# Patient Record
Sex: Female | Born: 1970 | Race: White | Hispanic: No | Marital: Married | Smoking: Former smoker
Health system: Southern US, Community
[De-identification: ages and names within clinical notes are randomized; demographics above are authoritative.]

## PROBLEM LIST (undated history)

## (undated) DIAGNOSIS — K219 Gastro-esophageal reflux disease without esophagitis: Secondary | ICD-10-CM

## (undated) DIAGNOSIS — L709 Acne, unspecified: Secondary | ICD-10-CM

## (undated) DIAGNOSIS — Z87448 Personal history of other diseases of urinary system: Secondary | ICD-10-CM

## (undated) DIAGNOSIS — R011 Cardiac murmur, unspecified: Secondary | ICD-10-CM

## (undated) DIAGNOSIS — I341 Nonrheumatic mitral (valve) prolapse: Secondary | ICD-10-CM

## (undated) DIAGNOSIS — J302 Other seasonal allergic rhinitis: Secondary | ICD-10-CM

## (undated) HISTORY — DX: Other seasonal allergic rhinitis: J30.2

## (undated) HISTORY — DX: Nonrheumatic mitral (valve) prolapse: I34.1

## (undated) HISTORY — DX: Gastro-esophageal reflux disease without esophagitis: K21.9

## (undated) HISTORY — DX: Cardiac murmur, unspecified: R01.1

## (undated) HISTORY — DX: Acne, unspecified: L70.9

## (undated) HISTORY — DX: Personal history of other diseases of urinary system: Z87.448

---
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
## 2003-07-05 ENCOUNTER — Other Ambulatory Visit: Admission: RE | Admit: 2003-07-05 | Discharge: 2003-07-05 | Payer: Self-pay | Admitting: Gynecology

## 2005-01-04 ENCOUNTER — Other Ambulatory Visit: Admission: RE | Admit: 2005-01-04 | Discharge: 2005-01-04 | Payer: Self-pay | Admitting: Gynecology

## 2005-03-01 ENCOUNTER — Inpatient Hospital Stay (HOSPITAL_COMMUNITY): Admission: AD | Admit: 2005-03-01 | Discharge: 2005-03-01 | Payer: Self-pay | Admitting: Gynecology

## 2005-03-27 ENCOUNTER — Inpatient Hospital Stay (HOSPITAL_COMMUNITY): Admission: AD | Admit: 2005-03-27 | Discharge: 2005-05-09 | Payer: Self-pay | Admitting: Gynecology

## 2005-03-28 ENCOUNTER — Ambulatory Visit: Payer: Self-pay | Admitting: Neonatology

## 2005-06-18 ENCOUNTER — Other Ambulatory Visit: Admission: RE | Admit: 2005-06-18 | Discharge: 2005-06-18 | Payer: Self-pay | Admitting: Gynecology

## 2006-06-22 ENCOUNTER — Other Ambulatory Visit: Admission: RE | Admit: 2006-06-22 | Discharge: 2006-06-22 | Payer: Self-pay | Admitting: Gynecology

## 2006-12-07 IMAGING — US US OB COMP +14 WK
1 series · 13 of 28 positions shown · non-contrast
Comparison: none

CLINICAL DATA: Assess growth and cervical length.

[Series 1: us ob comp +14 wk · 0.33mm/px · 13 of 52 slices shown]
[im 2/52]
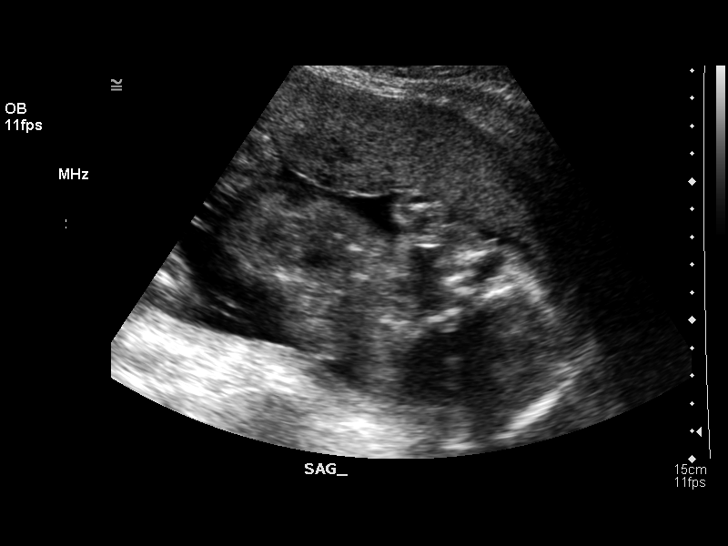
[im 6/52]
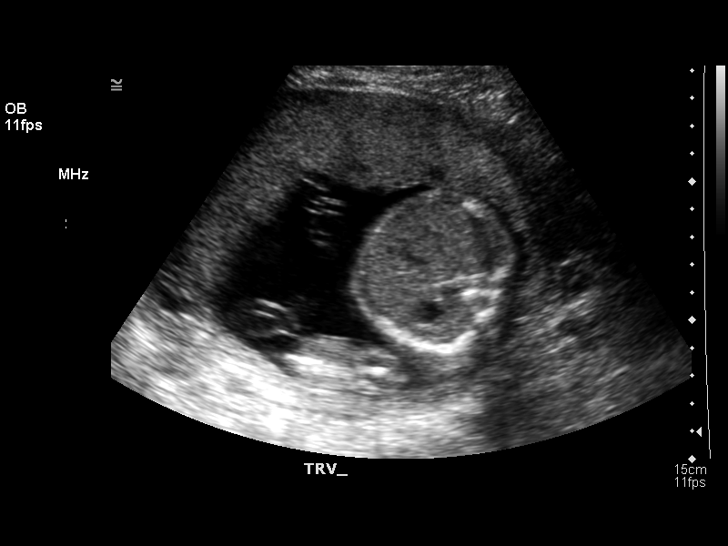
[im 10/52]
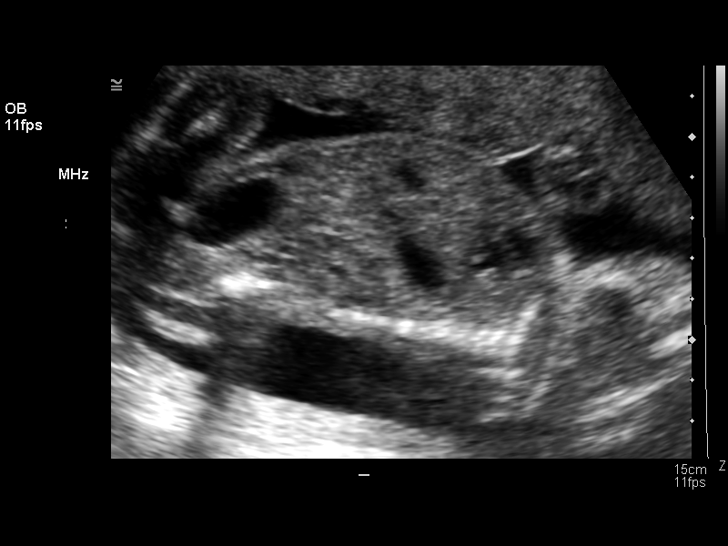
[im 14/52]
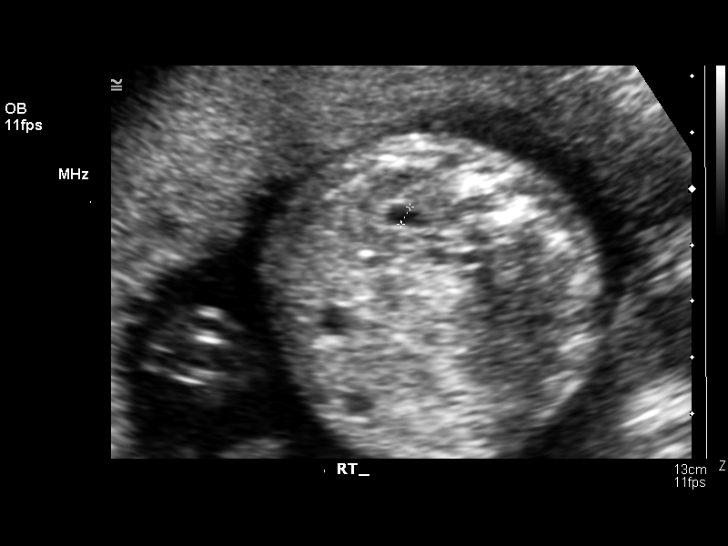
[im 18/52]
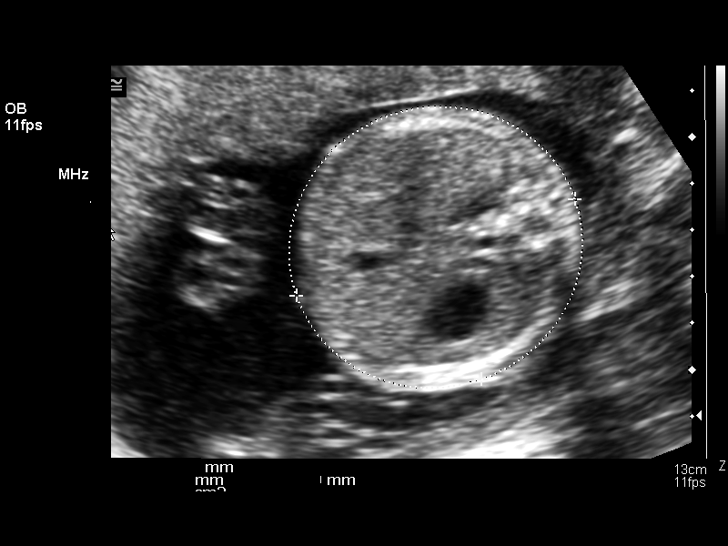
[im 21/52]
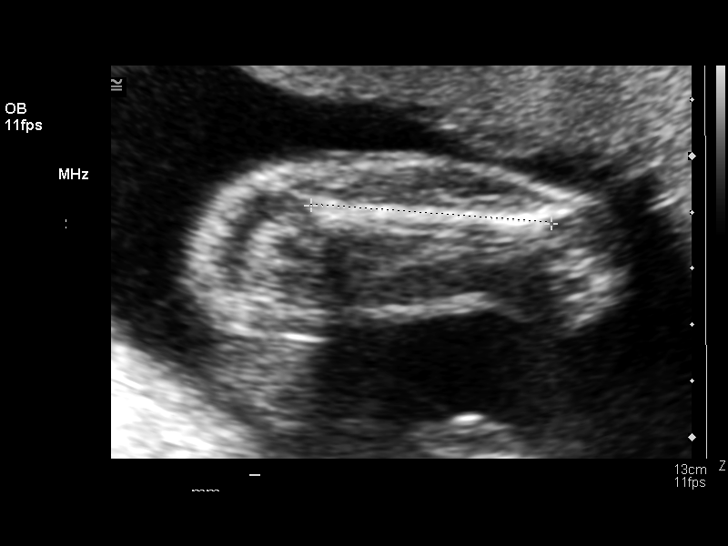
[im 27/52]
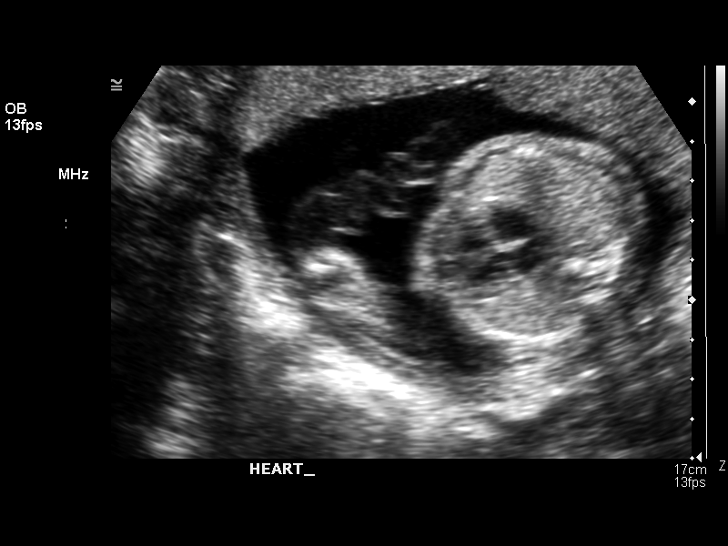
[im 31/52]
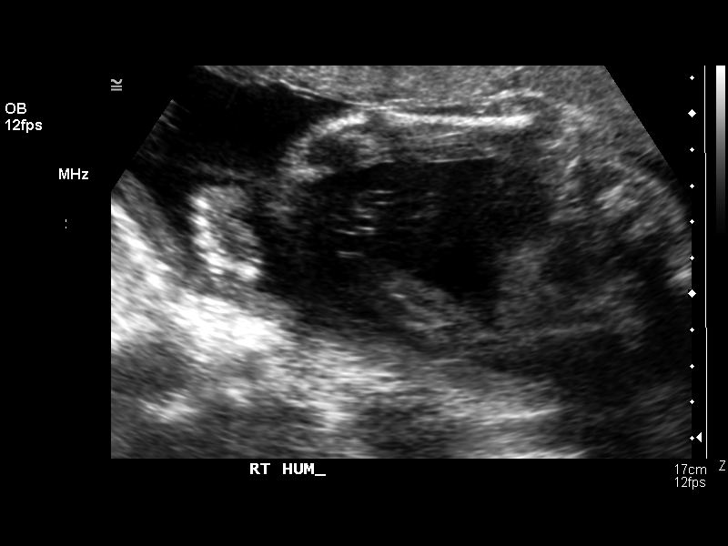
[im 35/52]
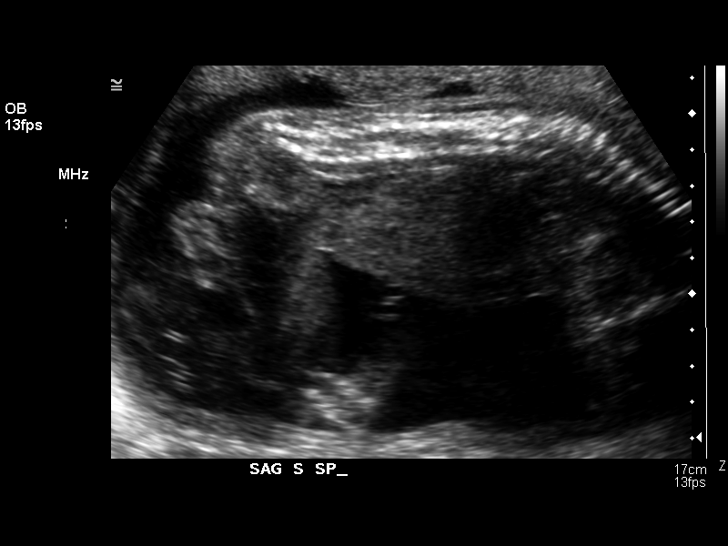
[im 38/52]
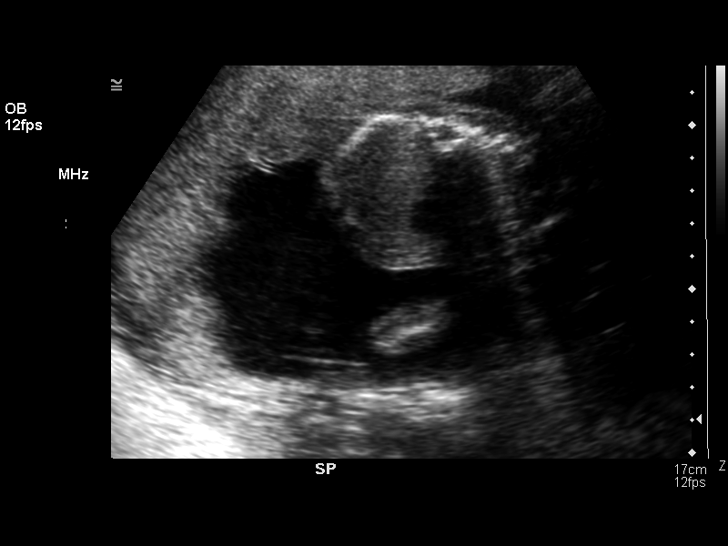
[im 42/52]
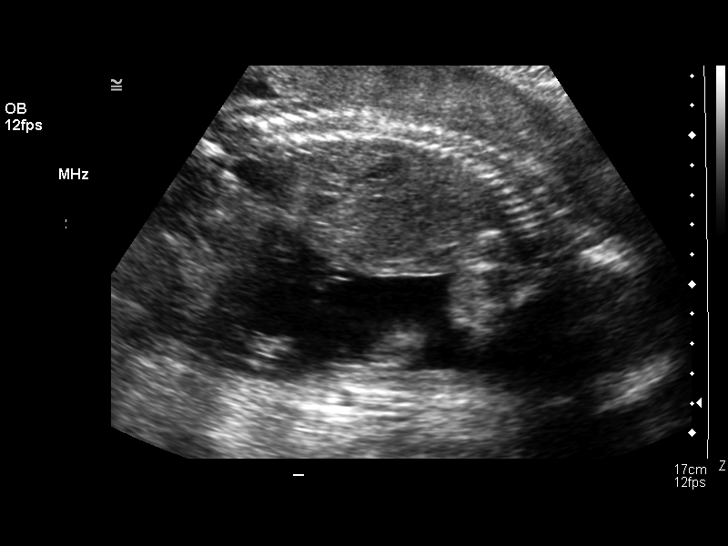
[im 46/52]
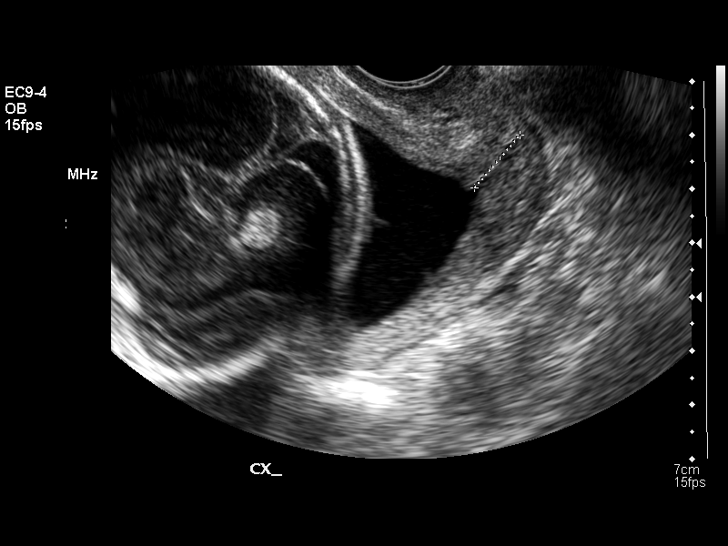
[im 50/52]
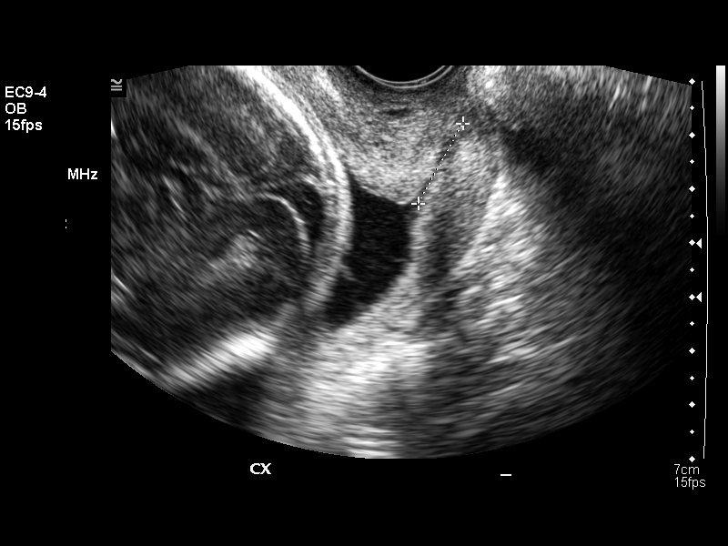

[13 of 28 positions shown; findings below may reference images not displayed]

OBSTETRICAL ULTRASOUND WITH TRANSVAGINAL:
Number of Fetuses:  1
Heart Rate:  139
Movement:  Yes
Breathing:    No  
Presentation:  Cephalic
Placental Location:  Anterior
Grade:  I
Previa:  No
Amniotic Fluid (Subjective):  Normal
Amniotic Fluid (Objective):   7.1 cm Vertical pocket 

FETAL BIOMETRY
BPD:  5.9 cm  24 w 1 d
HC:  22.0 cm   24 w 0 d
AC:  19.4 cm   24 w 1 d
FL:    4.3 cm   24 w 1 d

MEAN GA:  24 w 1 d

FETAL ANATOMY
Lateral Ventricles:    Previously seen  
Thalami/CSP:      Not visualized 
Posterior Fossa:  Not visualized 
Nuchal Region:    N/A
Spine:      Limited
4 Chamber Heart on Left:      Previously seen 
Stomach on Left:      Visualized 
3 Vessel Cord:    Visualized 
Cord Insertion site:    Visualized 
Kidneys:  Visualized 
Bladder:  Visualized 
Extremities:      Visualized 

MATERNAL UTERINE AND ADNEXAL FINDINGS
Cervix:   1.3 cm Transvaginally
Comments:  Dilatation at the level of the internal cervical os is noted measuring 19 cm.
IMPRESSION: 1.  Single intrauterine pregnancy demonstrating an estimated gestational age by ultrasound of 24 weeks and 1 day.  Correlation with assigned gestational age by LMP of 23 weeks and 3 days suggests appropriate growth.
2.  No late developing fetal anatomic abnormalities are identified associated with the stomach, kidneys or bladder.  Today the 3-vessel cord and extremities were visualized.  .
3.  Subjectively and quantitatively normal amniotic fluid volume.
4.  Shortened cervical length with dilatation at the level of the internal cervical os measuring 1.9 cm.  Today the patient?s cervix measures 1.3 cm endovaginally.

## 2006-12-21 IMAGING — US US OB LIMITED
1 series · 13 of 28 positions shown · non-contrast
Comparison: none

CLINICAL DATA: 23 weeks estimated gestational age with short cervix, on bed-rest for cramping.  Assess cervical length.

[Series 1: us ob limited · 0.35mm/px · 13 of 28 slices shown]
[im 2/28]
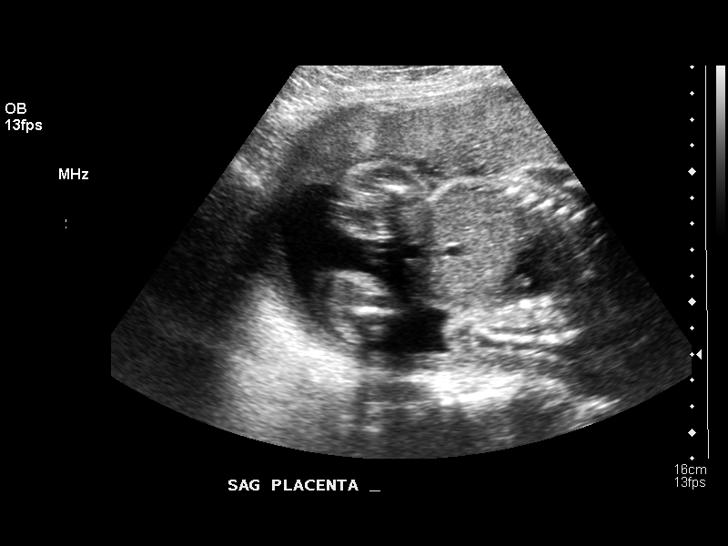
[im 4/28]
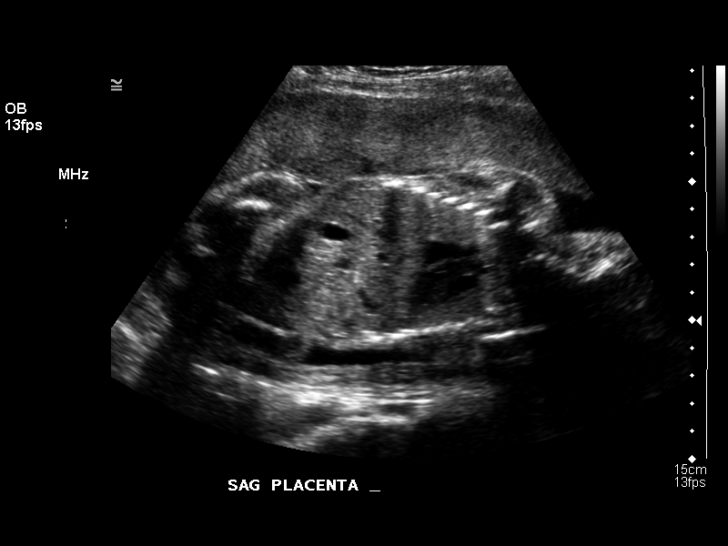
[im 6/28]
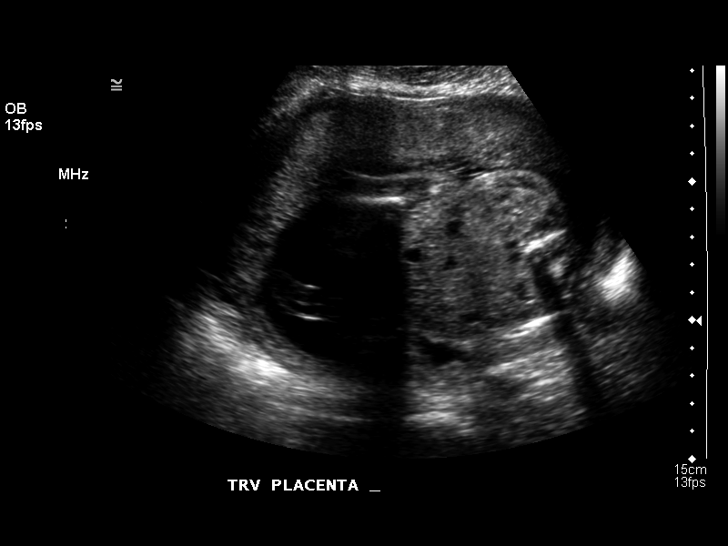
[im 8/28]
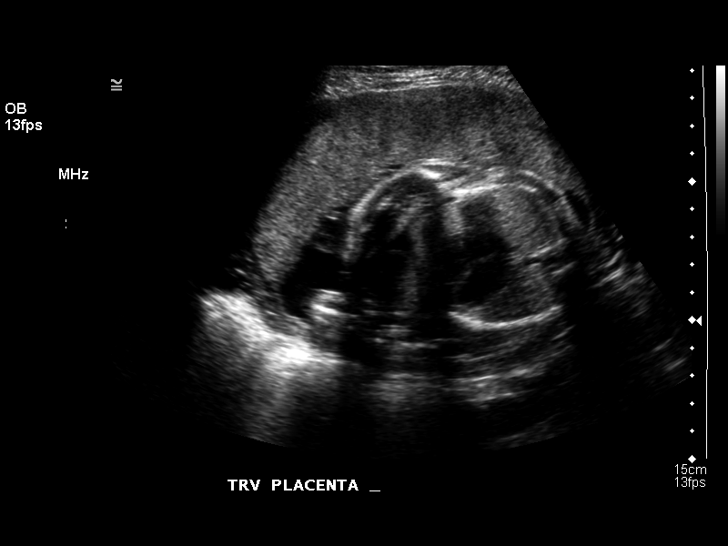
[im 10/28]
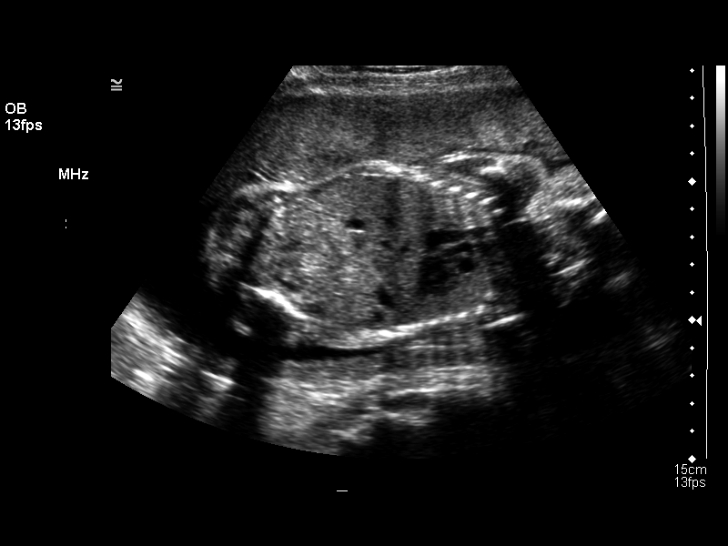
[im 12/28]
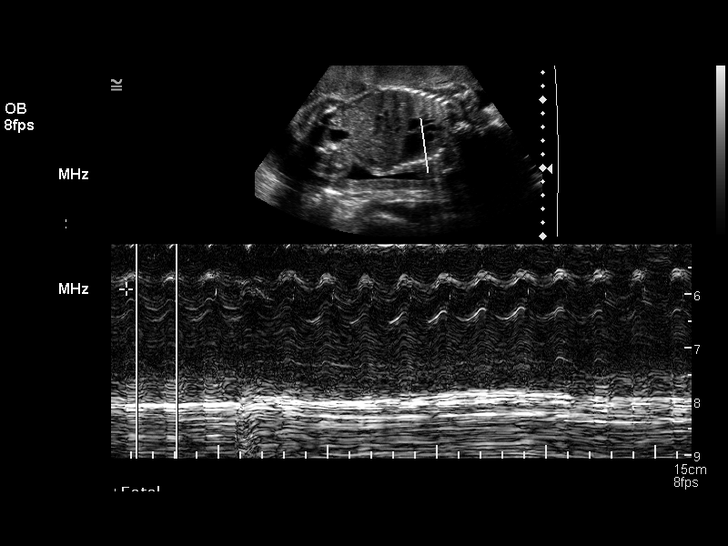
[im 15/28]
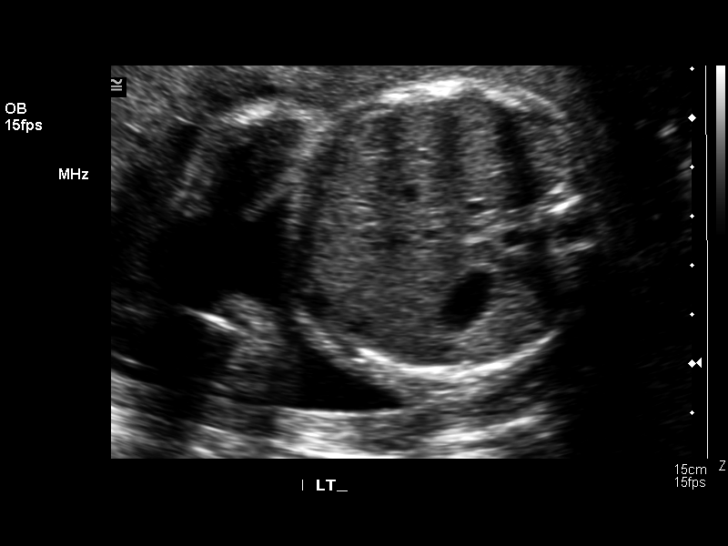
[im 17/28]
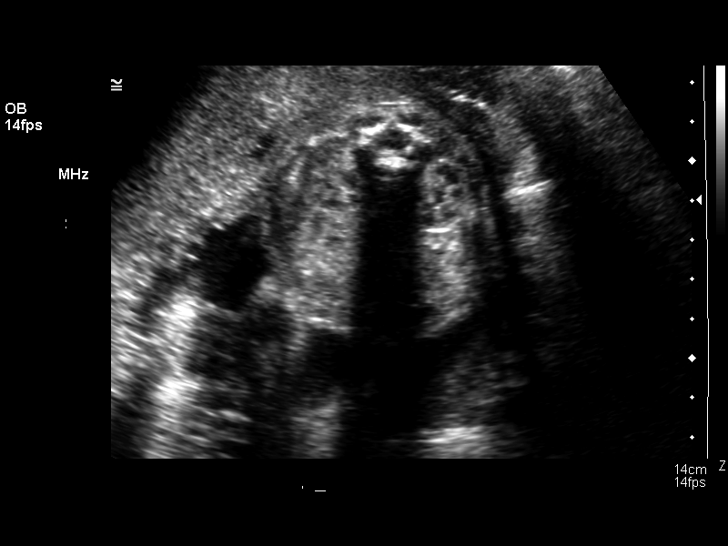
[im 19/28]
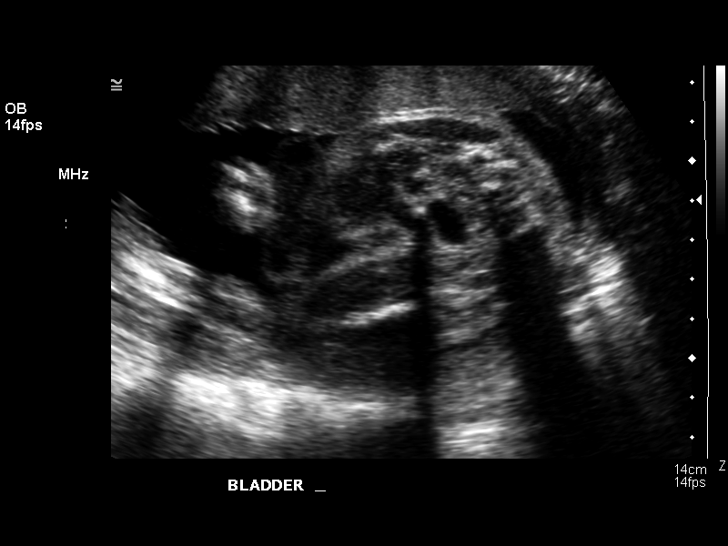
[im 21/28]
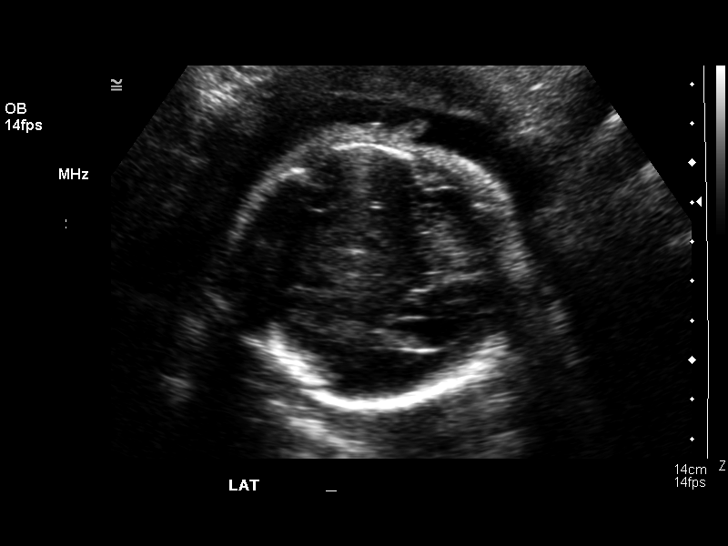
[im 23/28]
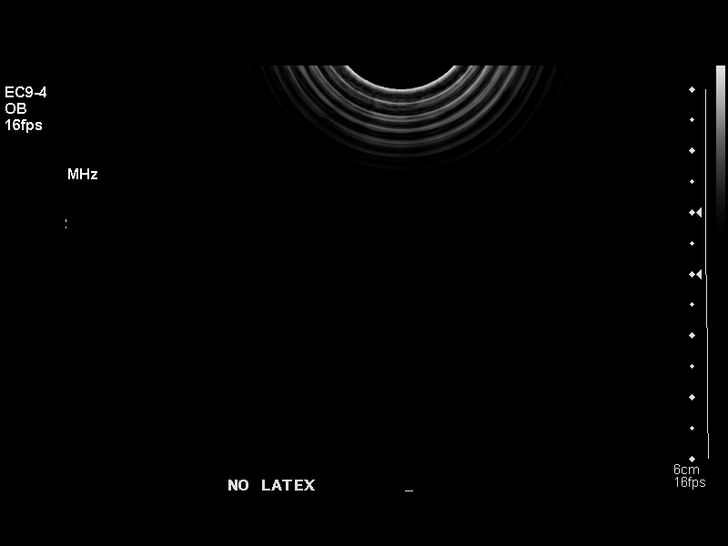
[im 25/28]
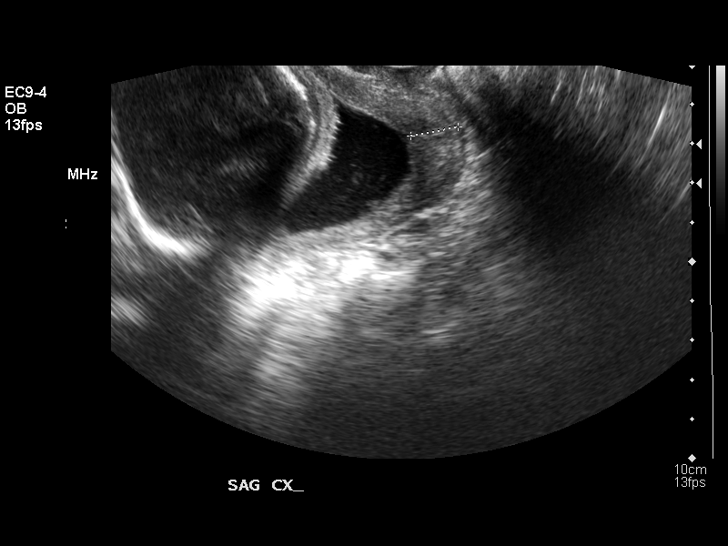
[im 27/28]
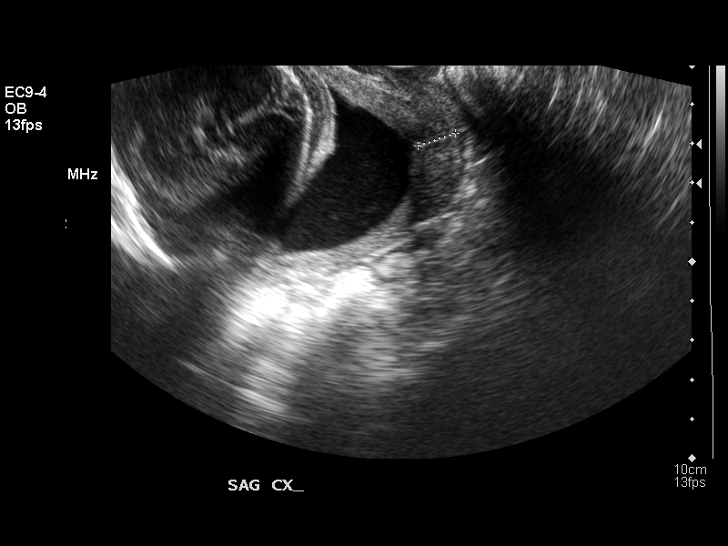

[13 of 28 positions shown; findings below may reference images not displayed]

LIMITED OBSTETRICAL ULTRASOUND WITH TRANSVAGINAL:
Number of Fetuses:  1
Heart Rate:  163
Movement:  Yes
Breathing:  No
Presentation:  Cephalic
Placental Location:  Anterior
Grade:  I
Previa:  No
Amniotic Fluid (Subjective):  Normal
Amniotic Fluid (Objective):  4.9 cm Vertical pocket 

Fetal measurements and complete anatomic evaluation were not requested.  The following fetal anatomy was visualized during this exam: Lateral ventricles, four chamber heart, stomach, kidneys and bladder. 

MATERNAL UTERINE AND ADNEXAL FINDINGS
Cervix:  1.2 cm Transvaginally.  Dilatation at the level of the internal cervical os is noted measuring 3.7 cm.
IMPRESSION: 1.  Persistent cervical shortening today measuring 1.2 cm which is largely unchanged in comparison with the prior exam of 04/07/05.  Further dilatation at the level of internal cervical os is noted today measuring 3.7 cm.  
2.  Subjectively and quantitatively normal amniotic fluid volume.
3.  No late developing fetal anatomic abnormalities are identified associated with the lateral ventricles, four chamber heart, stomach, kidneys or bladder.

## 2006-12-28 IMAGING — US US OB FOLLOW-UP
1 series · 18 of 28 positions shown · non-contrast
Comparison: none

CLINICAL DATA: Asses growth and cervical length.  G1 P0.  LMP 10/16/04
OBSTETRICAL ULTRASOUND RE-EVALUATION WITH TRANSVAGINAL:
Number of Fetuses:  1
Heart Rate:  158
Movement:  Yes
Breathing:  Yes
Presentation:  Cephalic
Placental Location:  Anterior
Grade:  I
Previa:  No
Amniotic Fluid (subjective):  Normal
Amniotic Fluid (objective):  5.4 cm Vertical pocket

[Series 1: us ob follow-up · 18 of 43 slices shown]
[im 1/43]
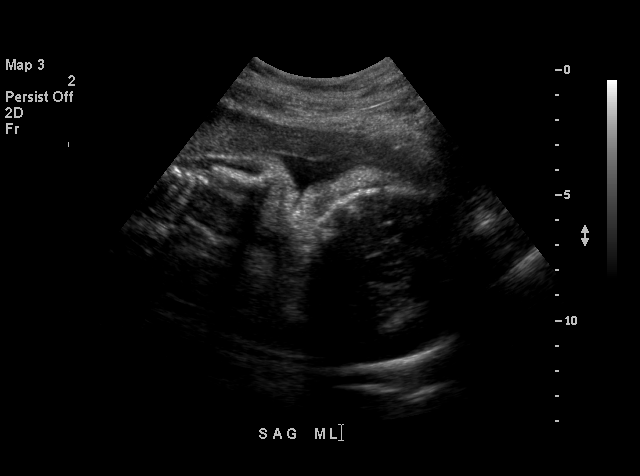
[im 4/43]
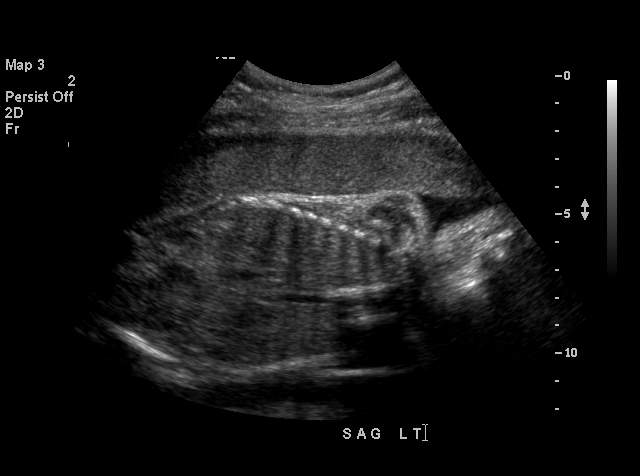
[im 5/43]
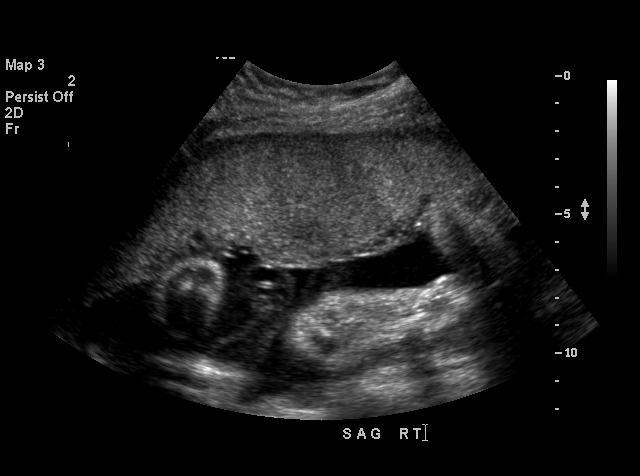
[im 8/43]
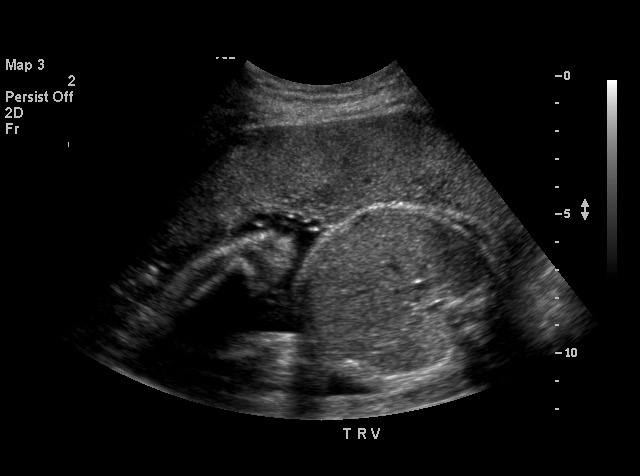
[im 11/43]
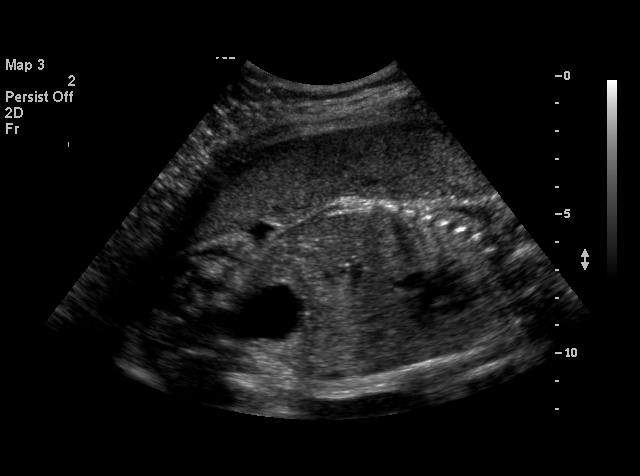
[im 13/43]
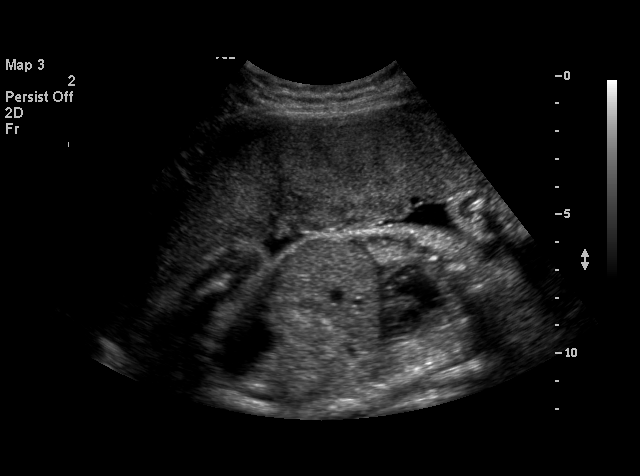
[im 16/43]
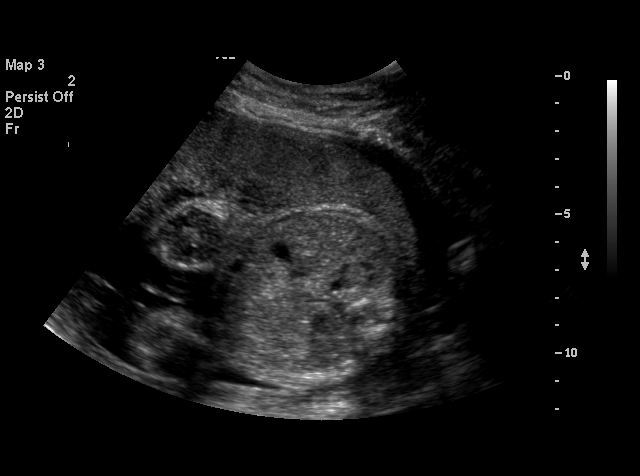
[im 18/43]
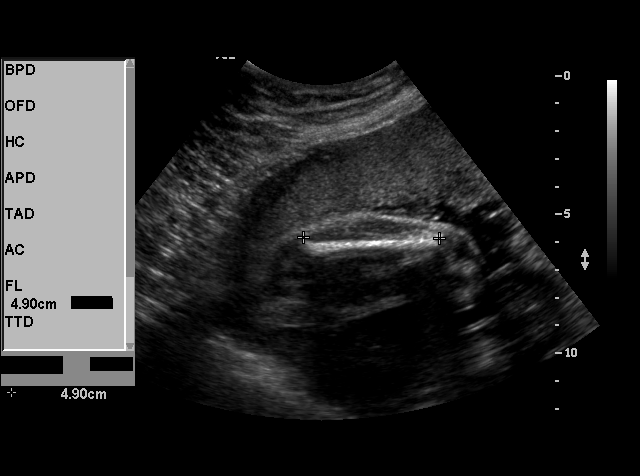
[im 21/43]
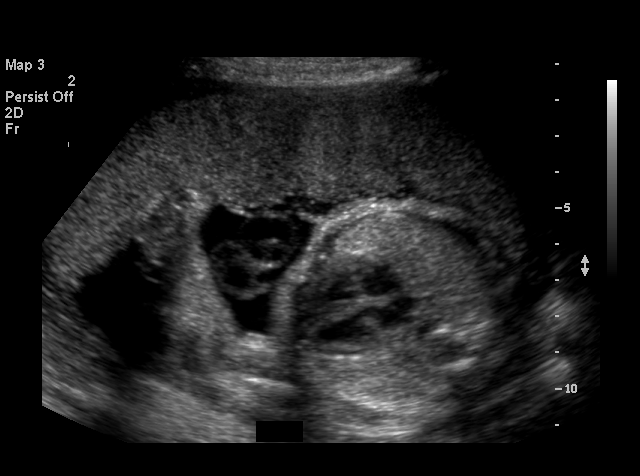
[im 22/43]
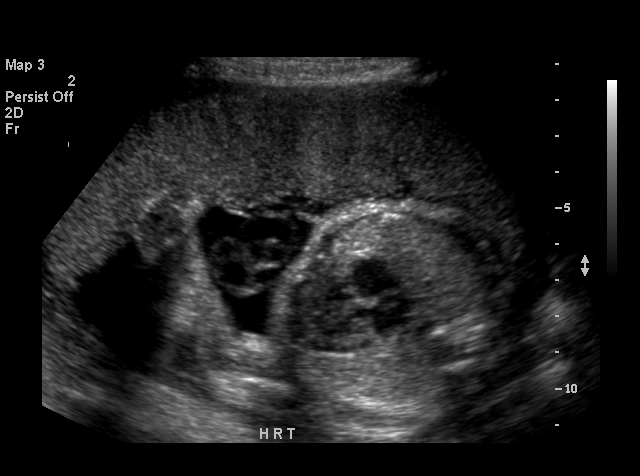
[im 25/43]
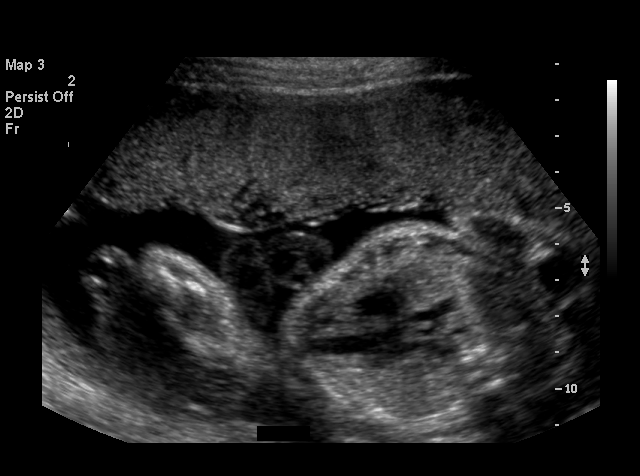
[im 27/43]
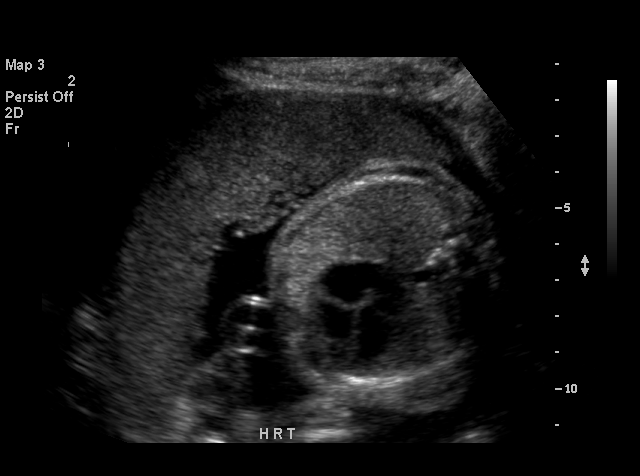
[im 30/43]
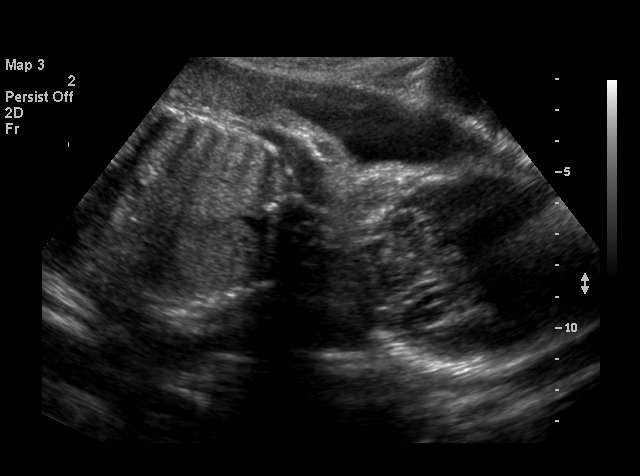
[im 33/43]
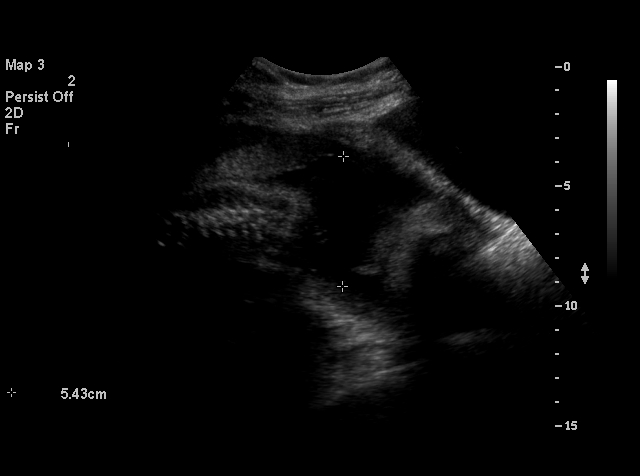
[im 35/43]
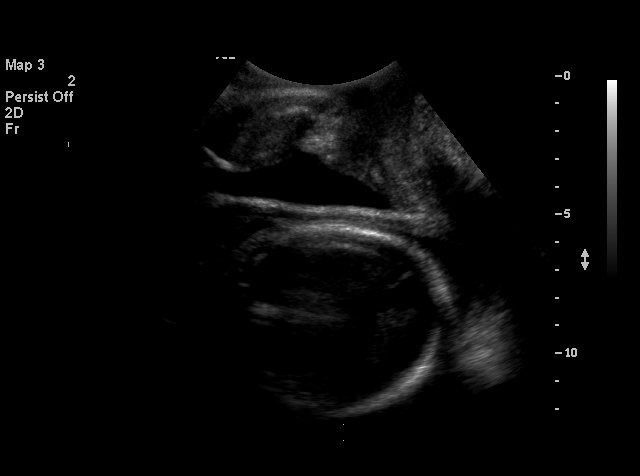
[im 38/43]
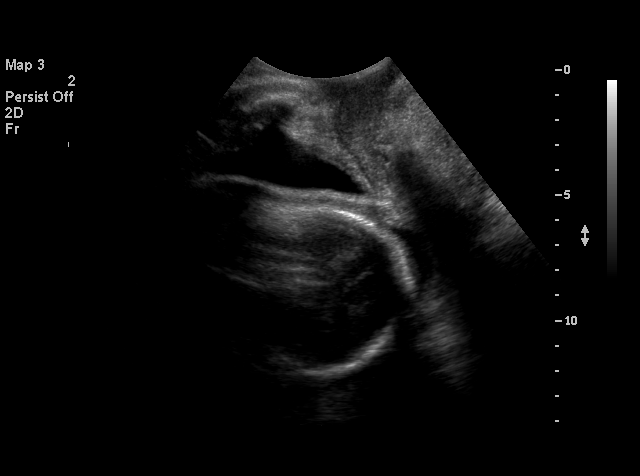
[im 39/43]
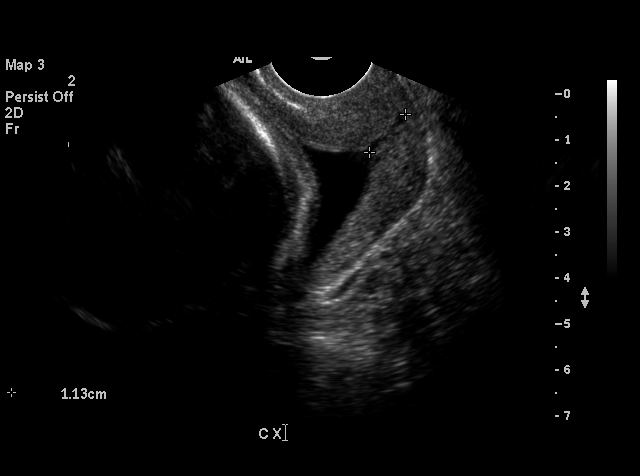
[im 43/43]
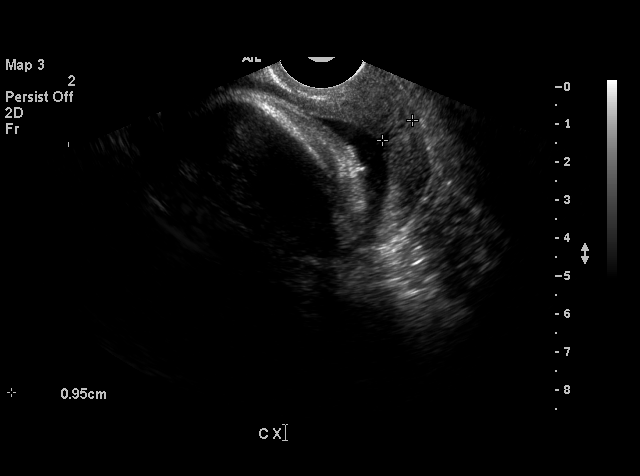

[18 of 28 positions shown; findings below may reference images not displayed]

FETAL BIOMETRY
BPD:  6.5 cm   26 w 3 d 
HC:  Not obtainable
AC:  22.1 cm   26 w 5 d
FL:  5.0 cm   27 w 1 d

Mean GA:  26 w 6 d
Assigned GA:  26 w 3 d (LMP)

FETAL ANATOMY
Lateral Ventricles:  Visualized 
Thalami/CSP:  Not visualized 
Posterior Fossa:  Not visualized 
Nuchal Region:  N/A
Spine:  Not visualized  
4 Chamber Heart on Left:  Visualized 
Stomach on Left:  Visualized 
3 Vessel Cord:  Visualized 
Cord Insertion Site:  Previously seen   
Kidneys:  Visualized 
Bladder:  Visualized 
Extremities:  Previously seen 

ADDITIONAL ANATOMY VISUALIZED:  Diaphragm and female genitalia. 

MATERNAL UTERINE AND ADNEXAL FINDINGS
Cervix:  .9 ? 1.13 cm Transvaginally
IMPRESSION: 1.  Single living intrauterine fetus in cephalic presentation.  Patient is 26 weeks 3 days by LMP dating and measures 26 weeks 6 days today indicating appropriate growth.
2.  Shortened cervix to 0.9 cm on transvaginal scanning.

## 2008-06-27 ENCOUNTER — Other Ambulatory Visit: Admission: RE | Admit: 2008-06-27 | Discharge: 2008-06-27 | Payer: Self-pay | Admitting: Gynecology

## 2008-06-27 ENCOUNTER — Ambulatory Visit: Payer: Self-pay | Admitting: Women's Health

## 2008-06-27 ENCOUNTER — Encounter: Payer: Self-pay | Admitting: Women's Health

## 2008-07-03 ENCOUNTER — Ambulatory Visit: Payer: Self-pay | Admitting: Women's Health

## 2011-02-19 NOTE — Consult Note (Signed)
Katelyn Delgado, Katelyn Delgado              ACCOUNT NO.:  [id number]   MEDICAL RECORD NO.:  [id number]          PATIENT TYPE:  INP   LOCATION:  9163                          FACILITY:  [facility]   PHYSICIAN:  Timothy P. Fontaine, M.D.DATE OF BIRTH:  09-23-1971   DATE OF CONSULTATION:  DATE OF DISCHARGE:                                   CONSULTATION   The patient underwent spontaneous rupture of membranes this morning at  approximately 29 weeks' gestation.  She was evaluated by Gaetano Hawthorne. Lily Peer,  M.D., was transferred to labor and delivery, and was vertex in presentation  with cervix reported at 1 cm.  She has previously received steroid pulmonary  prophylaxis at approximately 24 weeks, and she received a repeat dose of  betamethasone this morning with her transfer.  The patient initially was  without contractions but subsequently began contracting, and currently she  is contracting approximately every three minutes with a reactive fetal  tracing.  I reviewed the situation with the patient and the issue as to  whether to attempt tocolysis over the next 24 hours to allow a second dose  of steroids and then subsequently discontinue for delivery versus allowing  her to proceed now with delivery as her contractions continue.  The issue of  whether any advantage with repeat steroid administration at this point given  29 weeks, rupture of membranes, contracting, versus the risks of attempted  tocolysis to include neonatal/maternal infection and sepsis.  Also, the  question as to whether there would be benefit ultimately from a repeat  dosage.  After a lengthy discussion with the patient, we both agreed to  monitor at present, allow labor and delivery and not attempt transient  tocolysis.  The fetus has a reactive fetal tracing without any  decelerations.  Her last Lovenox dose was approximately 10 a.m. yesterday.  She is greater than 24 hours out, which would allow for regional anesthesia  if she chooses  during labor.  She is currently comfortable although is  clearly feeling her contractions.       TPF/MEDQ  D:  05/07/2005  T:  05/07/2005  Job:  [id number]

## 2011-02-19 NOTE — Discharge Summary (Signed)
Katelyn Delgado, Katelyn Delgado              ACCOUNT NO.:  [id number]   MEDICAL RECORD NO.:  [id number]          PATIENT TYPE:  INP   LOCATION:  9306                          FACILITY:  [facility]   PHYSICIAN:  Timothy P. Fontaine, M.D.DATE OF BIRTH:  [birth year]   DATE OF ADMISSION:  03/27/2005  DATE OF DISCHARGE:  05/09/2005                                 DISCHARGE SUMMARY   DISCHARGE DIAGNOSES:  1. Pregnancy 28-6/7th weeks gestation delivered.  2. Preterm labor.   PROCEDURE:  Spontaneous vaginal delivery May 07, 2005, midline episiotomy  repair of second-degree May 07, 2005, post partum uterine curettage May 07, 2005.   HOSPITAL COURSE:  A 40 year old, G1 P0, female noted to have a shorter  cervix at her routine obstetrical ultrasound at 18-20 weeks at 2.3 cm. The  patient was followed expectantly with rest. Did not have a strong history of  uterine contractions. Had a negative fetal fibronectin. On follow-up. she  was found to have a cervical length of 1.6 cm and external monitors was  found to be contracting every 2-3 minutes. The patient was begun on  magnesium sulfate, tocolysis, IV antibiotics, and underwent cervical  cultures. Her cultures all returned negative with exception of a positive  beta strep. The patient ultimately received a betamethasone fetal lung  prophylaxis at 24 weeks and was continued to be monitored with serial  cervical lengths with her cervix varying in lengths from a 0.6 centimeters  at the shortness up to 1.2 cm. The patient remained stable after being  switched to oral Procardia for tocolysis and ultimately underwent  spontaneous rupture of membranes without overt labor May 07, 2005.   Evaluation that morning confirmed gross rupture of membranes with a cervical  dilatation of 1 cm, vertex presentation, -1 station. The patient was  initially planned to be managed expectantly and received an additional dose  of betamethasone and subsequently begun  spontaneous contractions. Ultimately  proceeded to delivery time undergoing a spontaneous vaginal delivery at  2248, May 07, 2005 producing normal female, Apgar's 4, 6, and 9. Weight  1421 grams. Arterial cord pH 7.34.   The patient's postpartum course was uncomplicated. She was discharged on  postpartum day two ambulating well, tolerating regular diet, with a  hemoglobin of 9.4. The patient's blood type was not evident in her chart and  this was being drawn for RhoGAM determination before discharge; the results  of which are pending. The patient's rubella status also is not evident. She  is going to follow-up with this as an outpatient for vaccine determination.  The patient received precautions instructions and will be seen in the office  in six weeks. She received a prescription for Darvocet-N 100 #25 one to  three p.o. q.4-6h. p.r.n. pain.       TPF/MEDQ  D:  05/09/2005  T:  05/10/2005  Job:  [id number]

## 2011-02-19 NOTE — H&P (Signed)
Katelyn Delgado, Katelyn Delgado              ACCOUNT NO.:  [id number]   MEDICAL RECORD NO.:  [id number]          PATIENT TYPE:  INP   LOCATION:  9158                          FACILITY:  [facility]   PHYSICIAN:  Juan H. Lily Peer, M.D.DATE OF BIRTH:  Jun 02, 1971   DATE OF ADMISSION:  03/27/2005  DATE OF DISCHARGE:                                HISTORY & PHYSICAL   CHIEF COMPLAINT:  Abdominal pressure.   HISTORY:  The patient is a 40 year old, gravida 1, para 0, with an estimated  date of confinement of July 23, 2005.  Currently 23-1/[redacted] weeks gestation,  presented to [facility] complaining of lower abdominal pressure.  The  history is as follows:  The patient had been diagnosed with a short cervix  at the routine ultrasound screen, being 18-[redacted] weeks gestation, and her  cervical length was reported to be at approximately 2.3 cm in length.  Then  the following week, it was 2.1, and the following week after it was 1.8 and  last week was 1.6 cm in length.  A month ago, she had presented to [facility]  She had been dehydrated and had some contraction with resolving  intravenous fluids.  Last week, the patient had a fetal fibronectin which  was reported to be negative.  The patient states that this afternoon, her  house was hot because her air conditioning was not working, and she felt  nauseated and weak and getting ready to pass out and then once she started  taking fluids, her cramping began, and she contacted me, and I instructed  her to come to the hospital for further evaluation.  She was placed on the  monitor and was contracting every 2-3 minutes apart with reassuring fetal  heart rate tracing for a [redacted] week gestation fetus, and her vital signs  demonstrated a blood pressure of 119/64, temperature 98.8.  Her pulse was  79, respirations 14.  She was given Terbutaline 0.25 mg subcu and sent to  the ultrasound department for an ultrasound for cervical length measurement  but had received  0.25 mg of Terbutaline subcu in an effort to arrest the  contraction and then to be followed with magnesium sulfate.  This eventually  stopped the contractions.  The ultrasound demonstrated a fetus in the vertex  presentation with normal amniotic fluid for [redacted] week gestation, and the  cervical length measurement was 1.5 cm.  Cardiac activity was reported at  163 beats per minute.  She was admitted to the antepartum service and found  to be contracting once again and was started on magnesium sulfate, 4 g  bolus, followed by 2 g/hr.  GBS culture was obtained.  Her urinalysis was  unremarkable, but her wet prep did demonstrate moderate bacteria.   PAST MEDICAL HISTORY:  The patient is allergic to SULFA, states that since  she was 40 years of age, she has had a history of mitral valve prolapse.  No  other medical problems reported.  No prior surgical procedures to her  cervix.  She denied any vaginal discharge, and she had been at bed rest at  home.   PHYSICAL EXAMINATION:  VITAL SIGNS:  As described above.  HEENT:  Unremarkable.  NECK:  Supple.  Trachea midline.  No carotid bruits.  No thyromegaly.  LUNGS:  Clear to auscultation without rhonchi or wheezes.  HEART:  She had a grade 1 to grade 2 systolic ejection murmur right  parasternal border.  ABDOMEN:  Soft, nontender.  PELVIC EXAM:  Cervix was short, closed.  EXTREMITIES:  DTR 1+.  No clonus.  There was no edema.   ASSESSMENT:  A 40 year old gravida 1, para 0, at 23-1/[redacted] weeks gestation with  clinical evidence of cervical incompetence.  Could have been attributed to  preterm labor.  Currently, the patient with a wet prep demonstrating  moderate bacteria.  Her ultrasound with cervical length measurement at 1.5  cm and on bimanual examination, it was closed and short.  The patient will  be placed on Unasyn 3 g IV q.6h.  Awaiting result of the group B strep  culture.  She will also be placed on magnesium tocolysis at 4 g bolus of   magnesium sulfate followed by 2 g/hr.  The patient did have a genetic  amniocentesis at [redacted] weeks gestation.  Due to her approaching 40 years of  age, it was reported to be a normal 33 XX female.  The patient will have a  CBC drawn now.  She will be at strict bed rest.  Continuous monitoring for  contractions and fetal heart tones daily.  Will repeat the ultrasound in 48  hours for cervical length measurement, sooner if patient becomes more  symptomatic and does not respond to tocolytics.  We had a lengthy discussion  that at 23 weeks pregnancy if she would deliver at this point, it would be a  nonviable fetus that would not survive, and resuscitative efforts would not  be done this early in gestation.  Also, she is very early for steroids.  We  will wait to initiate at [redacted] weeks gestation.  We also had discussed  consideration of an emergency cerclage in a few days if there is enough  cervix to work with and after the bacteria has been eradicated from the  vagina after several days of the Unasyn intravenous antibiotic.  We also  discussed that there is a high risk of rupture of membranes with placement  of cerclage this far into her gestation and that perhaps that this would be  considered too dangerous as she could be at strict bedrest here in the  hospital for many weeks to come.  We would discuss condition on a daily  basis and keep them informed.       JHF/MEDQ  D:  03/27/2005  T:  03/27/2005  Job:  [id number]

## 2011-04-21 ENCOUNTER — Other Ambulatory Visit: Payer: Self-pay | Admitting: Women's Health

## 2011-04-21 ENCOUNTER — Other Ambulatory Visit (HOSPITAL_COMMUNITY)
Admission: RE | Admit: 2011-04-21 | Discharge: 2011-04-21 | Disposition: A | Discharge status: Home / Self Care | Payer: BC Managed Care – PPO | Source: Ambulatory Visit | Attending: Gynecology | Admitting: Gynecology

## 2011-04-21 ENCOUNTER — Encounter (INDEPENDENT_AMBULATORY_CARE_PROVIDER_SITE_OTHER): Payer: BC Managed Care – PPO | Admitting: Women's Health

## 2011-04-21 DIAGNOSIS — Z01419 Encounter for gynecological examination (general) (routine) without abnormal findings: Secondary | ICD-10-CM

## 2011-04-21 DIAGNOSIS — Z833 Family history of diabetes mellitus: Secondary | ICD-10-CM

## 2011-04-21 DIAGNOSIS — Z124 Encounter for screening for malignant neoplasm of cervix: Secondary | ICD-10-CM | POA: Insufficient documentation

## 2011-04-21 DIAGNOSIS — Z13220 Encounter for screening for lipoid disorders: Secondary | ICD-10-CM

## 2011-04-21 DIAGNOSIS — R823 Hemoglobinuria: Secondary | ICD-10-CM

## 2011-04-22 ENCOUNTER — Encounter: Payer: Self-pay | Admitting: *Deleted

## 2011-08-04 ENCOUNTER — Encounter: Payer: Self-pay | Admitting: Gynecology

## 2011-08-04 ENCOUNTER — Ambulatory Visit (INDEPENDENT_AMBULATORY_CARE_PROVIDER_SITE_OTHER): Payer: BC Managed Care – PPO | Admitting: Gynecology

## 2011-08-04 DIAGNOSIS — N644 Mastodynia: Secondary | ICD-10-CM

## 2011-08-04 NOTE — Progress Notes (Signed)
Patient presents to having thought she felt something in her left breast her husband confirmed this. It is tender has her both breasts now. She is due for her period this coming week. She recently had mammogram screening at [facility] this past month which was normal. No history of breast lumps before.  Exam  chaperone present Breasts examined lying and sitting without masses retractions discharge adenopathy. There is she's pointing to is in the lateral aspect of her left breast she is unable to demonstrate a mass today on exam.  Assessment and plan: Mastodynia transient mass felt in left breast by patient and husband now she cannot demonstrate it and she has a normal breast exam I physician. Mammogram this past month was negative on screening. Rest patient to wait until her periods over reexamine herself have her husband examine herself and if they can no longer demonstrate any palpable abnormalities we'll follow if she has any questions or abnormalities at all she knows to call me and we'll start with ultrasound possible diagnostic mammogram.

## 2011-08-06 ENCOUNTER — Encounter: Payer: Self-pay | Admitting: Women's Health

## 2012-04-26 ENCOUNTER — Encounter: Payer: Self-pay | Admitting: Gynecology

## 2012-06-01 ENCOUNTER — Encounter: Payer: BC Managed Care – PPO | Admitting: Women's Health

## 2012-06-02 ENCOUNTER — Encounter: Payer: BC Managed Care – PPO | Admitting: Women's Health

## 2013-11-08 ENCOUNTER — Encounter: Payer: Self-pay | Admitting: Women's Health

## 2013-11-08 ENCOUNTER — Ambulatory Visit (INDEPENDENT_AMBULATORY_CARE_PROVIDER_SITE_OTHER): Payer: BC Managed Care – PPO | Admitting: Women's Health

## 2013-11-08 ENCOUNTER — Other Ambulatory Visit (HOSPITAL_COMMUNITY)
Admission: RE | Admit: 2013-11-08 | Discharge: 2013-11-08 | Disposition: A | Discharge status: Home / Self Care | Payer: BC Managed Care – PPO | Source: Ambulatory Visit | Attending: Women's Health | Admitting: Women's Health

## 2013-11-08 VITALS — BP 108/72 | Ht 63.25 in | Wt 147.2 lb

## 2013-11-08 DIAGNOSIS — F329 Major depressive disorder, single episode, unspecified: Secondary | ICD-10-CM

## 2013-11-08 DIAGNOSIS — Z01419 Encounter for gynecological examination (general) (routine) without abnormal findings: Secondary | ICD-10-CM

## 2013-11-08 DIAGNOSIS — Z833 Family history of diabetes mellitus: Secondary | ICD-10-CM

## 2013-11-08 DIAGNOSIS — F32A Depression, unspecified: Secondary | ICD-10-CM | POA: Insufficient documentation

## 2013-11-08 DIAGNOSIS — Z13220 Encounter for screening for lipoid disorders: Secondary | ICD-10-CM

## 2013-11-08 DIAGNOSIS — F3289 Other specified depressive episodes: Secondary | ICD-10-CM

## 2013-11-08 DIAGNOSIS — Z1151 Encounter for screening for human papillomavirus (HPV): Secondary | ICD-10-CM | POA: Insufficient documentation

## 2013-11-08 LAB — CBC WITH DIFFERENTIAL/PLATELET
BASOS ABS: 0 10*3/uL (ref 0.0–0.1)
BASOS PCT: 0 % (ref 0–1)
EOS ABS: 0.1 10*3/uL (ref 0.0–0.7)
EOS PCT: 2 % (ref 0–5)
HEMATOCRIT: 43.4 % (ref 36.0–46.0)
Hemoglobin: 14.5 g/dL (ref 12.0–15.0)
Lymphocytes Relative: 27 % (ref 12–46)
Lymphs Abs: 2 10*3/uL (ref 0.7–4.0)
MCH: 32.2 pg (ref 26.0–34.0)
MCHC: 33.4 g/dL (ref 30.0–36.0)
MCV: 96.2 fL (ref 78.0–100.0)
MONO ABS: 0.7 10*3/uL (ref 0.1–1.0)
MONOS PCT: 10 % (ref 3–12)
NEUTROS ABS: 4.5 10*3/uL (ref 1.7–7.7)
NEUTROS PCT: 61 % (ref 43–77)
PLATELETS: 260 10*3/uL (ref 150–400)
RBC: 4.51 MIL/uL (ref 3.87–5.11)
RDW: 12.7 % (ref 11.5–15.5)
WBC: 7.3 10*3/uL (ref 4.0–10.5)

## 2013-11-08 LAB — LIPID PANEL
CHOL/HDL RATIO: 3.2 ratio
CHOLESTEROL: 169 mg/dL (ref 0–200)
HDL: 53 mg/dL (ref >39)
LDL Cholesterol: 92 mg/dL (ref 0–99)
TRIGLYCERIDES: 118 mg/dL (ref <150)
VLDL: 24 mg/dL (ref 0–40)

## 2013-11-08 LAB — URINALYSIS W MICROSCOPIC + REFLEX CULTURE
Bacteria, UA: NONE SEEN
Bilirubin Urine: NEGATIVE
CASTS: NONE SEEN
CRYSTALS: NONE SEEN
Glucose, UA: NEGATIVE mg/dL
Ketones, ur: NEGATIVE mg/dL
LEUKOCYTES UA: NEGATIVE
NITRITE: NEGATIVE
PH: 5.5 (ref 5.0–8.0)
PROTEIN: NEGATIVE mg/dL
SPECIFIC GRAVITY, URINE: 1.021 (ref 1.005–1.030)
Urobilinogen, UA: 0.2 mg/dL (ref 0.0–1.0)

## 2013-11-08 LAB — TSH: TSH: 0.746 u[IU]/mL (ref 0.350–4.500)

## 2013-11-08 LAB — GLUCOSE, RANDOM: Glucose, Bld: 83 mg/dL (ref 70–99)

## 2013-11-08 MED ORDER — BUPROPION HCL ER (XL) 150 MG PO TB24: 150.0000 mg | ORAL_TABLET | Freq: Every day | ORAL | Status: DC

## 2013-11-08 NOTE — Progress Notes (Signed)
Katelyn Delgado 05-21-1971 [id number]    History:    Presents for annual exam.  Regular monthly cycle, condoms. Normal Pap history. Normal mammogram 2013. 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  Past medical history, past surgical history, family history and social history were all reviewed and documented in the EPIC chart. Dental assistant. Katelyn Delgado 7 doing well, delivered at 28 weeks. Mother hypertension.  ROS:  A  ROS was performed and pertinent positives and negatives are included.  Exam:  Filed Vitals:   11/08/13 0829  BP: 108/72    General appearance:  Normal Thyroid:  Symmetrical, normal in size, without palpable masses or nodularity. Respiratory  Auscultation:  Clear without wheezing or rhonchi Cardiovascular  Auscultation:  Regular rate, without rubs, murmurs or gallops  Edema/varicosities:  Not grossly evident Abdominal  Soft,nontender, without masses, guarding or rebound.  Liver/spleen:  No organomegaly noted  Hernia:  None appreciated  Skin  Inspection:  Grossly normal   Breasts: Examined lying and sitting.     Right: Without masses, retractions, discharge or axillary adenopathy.     Left: Without masses, retractions, discharge or axillary adenopathy. Gentitourinary   Inguinal/mons:  Normal without inguinal adenopathy  External genitalia:  Normal  BUS/Urethra/Skene's glands:  Normal  Vagina:  Normal  Cervix:  Normal  Uterus:   normal in size, shape and contour.  Midline and mobile  Adnexa/parametria:     Rt: Without masses or tenderness.   Lt: Without masses or tenderness.  Anus and perineum: Normal  Digital rectal exam: Normal sphincter tone without palpated masses or tenderness  Assessment/Plan:  43 y.o. MWF G1P1 for annual exam with complaint of increased stress/depression. Has used Wellbutrin in the past and would like to try again.  Anxiety/depression Normal GYN exam/condoms  Plan: Contraception options reviewed and declined will continue condoms.  Counseling reviewed and encouraged, states will think about, Wellbutrin 150 XL by mouth daily prescription, proper use given and reviewed, instructed to call if no relief of symptoms. SBE's, reviewed importance of annual screening mammogram, instructed to schedule. Increase regular exercise, calcium rich diet, vitamin D 1000 daily encouraged. CBC, glucose, TSH, lipid panel, UA, Pap. Pap normal 03/2011. HPV typing added, reviewed new screening guidelines.   [place], 9:18 AM 11/08/2013

## 2013-11-08 NOTE — Addendum Note (Signed)
Addended by: Alen Blew on: 11/08/2013 10:47 AM   Modules accepted: Orders

## 2013-11-08 NOTE — Patient Instructions (Signed)

## 2013-11-16 ENCOUNTER — Encounter: Payer: Self-pay | Admitting: Gynecology

## 2014-01-16 ENCOUNTER — Other Ambulatory Visit: Payer: Self-pay | Admitting: Women's Health

## 2014-01-16 ENCOUNTER — Ambulatory Visit (INDEPENDENT_AMBULATORY_CARE_PROVIDER_SITE_OTHER): Payer: BC Managed Care – PPO | Admitting: Women's Health

## 2014-01-16 ENCOUNTER — Encounter: Payer: Self-pay | Admitting: Women's Health

## 2014-01-16 ENCOUNTER — Ambulatory Visit (INDEPENDENT_AMBULATORY_CARE_PROVIDER_SITE_OTHER): Payer: BC Managed Care – PPO

## 2014-01-16 DIAGNOSIS — D252 Subserosal leiomyoma of uterus: Secondary | ICD-10-CM

## 2014-01-16 DIAGNOSIS — R102 Pelvic and perineal pain unspecified side: Secondary | ICD-10-CM

## 2014-01-16 DIAGNOSIS — R1031 Right lower quadrant pain: Secondary | ICD-10-CM

## 2014-01-16 DIAGNOSIS — N8300 Follicular cyst of ovary, unspecified side: Secondary | ICD-10-CM

## 2014-01-16 DIAGNOSIS — D259 Leiomyoma of uterus, unspecified: Secondary | ICD-10-CM

## 2014-01-16 DIAGNOSIS — N949 Unspecified condition associated with female genital organs and menstrual cycle: Secondary | ICD-10-CM

## 2014-01-16 DIAGNOSIS — R109 Unspecified abdominal pain: Secondary | ICD-10-CM

## 2014-01-16 NOTE — Progress Notes (Signed)
Patient ID: Sumayah Bearse, female   DOB: 13-Sep-1971, 43 y.o.   MRN: [id number] Presents with complaint of right lower abdominal discomfort for the past week increased in the last 3 days. Was seen at urgent care was told it was a pulled muscle. States has had no change in activity or routine. Denies pain or burning with urination, vaginal discharge, fever or back pain. Denies constipation, states has always had GI problems with frequent bowel movements. Monthly cycle/condoms. History of benign hematuria many years ago had a negative workup by urologist unsure of name. Mother history of kidney cancer, father history of kidney stones.  Exam: Appears uncomfortable. Abdomen is soft no rebound or radiation of pain. Slight discomfort on the right lower quadrant near iliac crest. Speculum exam,no discharge or erythema noted. Bimanual no adnexal fullness slight tenderness right adnexa above level of the ovary.  Ultrasound: Anteverted uterus, fibroid subserous 12 x 13 mm, left 36 x 27 x 23 mm. Right and left ovary both normal. Negative cul-de-sac. Right kidney prominent renal vessel. Right kidney area  location of increased pain.  left kidney normal.  Right lower quadrant pain/probable kidney origin Fibroid uterus  Plan: UA pending. Options reviewed to watch or schedule consult with urologist to evaluate. Prefers urology consult.

## 2014-01-17 LAB — URINALYSIS W MICROSCOPIC + REFLEX CULTURE
BILIRUBIN URINE: NEGATIVE
Casts: NONE SEEN
Crystals: NONE SEEN
GLUCOSE, UA: NEGATIVE mg/dL
KETONES UR: NEGATIVE mg/dL
Leukocytes, UA: NEGATIVE
Nitrite: NEGATIVE
PROTEIN: NEGATIVE mg/dL
Specific Gravity, Urine: 1.026 (ref 1.005–1.030)
Urobilinogen, UA: 0.2 mg/dL (ref 0.0–1.0)
pH: 5.5 (ref 5.0–8.0)

## 2014-01-18 ENCOUNTER — Telehealth: Payer: Self-pay | Admitting: *Deleted

## 2014-01-18 LAB — URINE CULTURE

## 2014-01-18 NOTE — Telephone Encounter (Signed)
I called to schedule appointment has to speak with nurse regarding appointment. Told to fax notes and they will contact patient with time and date, asked office to inform me as well.

## 2014-01-18 NOTE — Telephone Encounter (Signed)
Message copied by Thamas Jaegers on Fri Jan 18, 2014  3:45 PM ------      Message from: [place], [place] J      Created: Thu Jan 17, 2014  6:53 AM       Anderson Malta, she needs a urology consult, she is having right-sided pain over her kidney,  normal GYN ultrasound but right kidney prominent  renal vessel, area of pain, mother history of renal cancer. Dr. Diona Fanti  if available. ------

## 2014-01-24 NOTE — Telephone Encounter (Signed)
Pt saw Dr.Dahlstedt on 01/23/14.

## 2014-08-05 ENCOUNTER — Encounter: Payer: Self-pay | Admitting: Women's Health

## 2014-11-15 ENCOUNTER — Encounter: Payer: Self-pay | Admitting: Women's Health

## 2014-11-15 ENCOUNTER — Ambulatory Visit (INDEPENDENT_AMBULATORY_CARE_PROVIDER_SITE_OTHER): Payer: BLUE CROSS/BLUE SHIELD | Admitting: Women's Health

## 2014-11-15 VITALS — BP 111/72 | Ht 62.0 in | Wt 148.4 lb

## 2014-11-15 DIAGNOSIS — Z01419 Encounter for gynecological examination (general) (routine) without abnormal findings: Secondary | ICD-10-CM

## 2014-11-15 DIAGNOSIS — F4323 Adjustment disorder with mixed anxiety and depressed mood: Secondary | ICD-10-CM

## 2014-11-15 LAB — CBC WITH DIFFERENTIAL/PLATELET
Basophils Absolute: 0 10*3/uL (ref 0.0–0.1)
Basophils Relative: 0 % (ref 0–1)
EOS PCT: 2 % (ref 0–5)
Eosinophils Absolute: 0.2 10*3/uL (ref 0.0–0.7)
HCT: 41.2 % (ref 36.0–46.0)
Hemoglobin: 13.7 g/dL (ref 12.0–15.0)
LYMPHS PCT: 27 % (ref 12–46)
Lymphs Abs: 2.2 10*3/uL (ref 0.7–4.0)
MCH: 32.6 pg (ref 26.0–34.0)
MCHC: 33.3 g/dL (ref 30.0–36.0)
MCV: 98.1 fL (ref 78.0–100.0)
MPV: 9.2 fL (ref 8.6–12.4)
Monocytes Absolute: 0.7 10*3/uL (ref 0.1–1.0)
Monocytes Relative: 9 % (ref 3–12)
NEUTROS ABS: 5 10*3/uL (ref 1.7–7.7)
Neutrophils Relative %: 62 % (ref 43–77)
Platelets: 275 10*3/uL (ref 150–400)
RBC: 4.2 MIL/uL (ref 3.87–5.11)
RDW: 12.8 % (ref 11.5–15.5)
WBC: 8.1 10*3/uL (ref 4.0–10.5)

## 2014-11-15 LAB — COMPREHENSIVE METABOLIC PANEL
ALK PHOS: 45 U/L (ref 39–117)
ALT: 15 U/L (ref 0–35)
AST: 14 U/L (ref 0–37)
Albumin: 3.8 g/dL (ref 3.5–5.2)
BUN: 11 mg/dL (ref 6–23)
CO2: 25 mEq/L (ref 19–32)
CREATININE: 0.79 mg/dL (ref 0.50–1.10)
Calcium: 8.8 mg/dL (ref 8.4–10.5)
Chloride: 107 mEq/L (ref 96–112)
Glucose, Bld: 85 mg/dL (ref 70–99)
Potassium: 4.6 mEq/L (ref 3.5–5.3)
Sodium: 138 mEq/L (ref 135–145)
Total Bilirubin: 0.5 mg/dL (ref 0.2–1.2)
Total Protein: 6.2 g/dL (ref 6.0–8.3)

## 2014-11-15 MED ORDER — BUPROPION HCL ER (XL) 300 MG PO TB24: 300.0000 mg | ORAL_TABLET | Freq: Every day | ORAL | Status: DC

## 2014-11-15 NOTE — Progress Notes (Signed)
Katelyn Delgado Delgado [birth year] [id number]    History:    Presents for annual exam.  Regular monthly cycle/condoms. Normal Pap and mammogram history. Currently on Wellbutrin 150 which has helped depression but would like a higher dose. Has had some right sided pain negative CT of the pelvis 01/2014. Normal lipid panel 2015. Had problems with elevated blood pressure when on combination pills.  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 Maternal aunt and maternal grandmother breast cancer after menopause. Mother hypertension.  ROS:  A ROS was performed and pertinent positives and negatives are included.  Exam:  Filed Vitals:   11/15/14 0835  BP: 111/72    General appearance:  Normal Thyroid:  Symmetrical, normal in size, without palpable masses or nodularity. Respiratory  Auscultation:  Clear without wheezing or rhonchi Cardiovascular  Auscultation:  Regular rate, without rubs, murmurs or gallops  Edema/varicosities:  Not grossly evident Abdominal  Soft,nontender, without masses, guarding or rebound.  Liver/spleen:  No organomegaly noted  Hernia:  None appreciated  Skin  Inspection:  Grossly normal   Breasts: Examined lying and sitting.     Right: Without masses, retractions, discharge or axillary adenopathy.     Left: Without masses, retractions, discharge or axillary adenopathy. Gentitourinary   Inguinal/mons:  Normal without inguinal adenopathy  External genitalia:  Normal  BUS/Urethra/Skene's glands:  Normal  Vagina:  Normal  Cervix:  Normal  Uterus:   normal in size, shape and contour.  Midline and mobile  Adnexa/parametria:     Rt: Without masses or tenderness.   Lt: Without masses or tenderness.  Anus and perineum: Normal  Digital rectal exam: Normal sphincter tone without palpated masses or tenderness  Assessment/Plan:  44 y.o. MWF G1 P1 for  annual exam.    Contraception management Normal GYN exam Depression on Wellbutrin  Plan: Contraception options reviewed, Mirena IUD information given and reviewed slight risk for hemorrhage, perforation, infection. Will check coverage and schedule with Dr. Phineas Real with next cycle. Condoms until. SBE's, annual screening mammogram, 3-D tomography reviewed and encouraged history of dense breasts. CBC, CMP, UA, Pap normal with negative HR HPV 2015, new screening guidelines reviewed. Will try Wellbutrin 300 XL prescription, proper use given and reviewed denies need for counseling reports home and work life good doing much better on Wellbutrin but feels a higher dose may be helpful. Will call if no relief, counseling again reviewed. Katelyn Delgado Delgado [facility], 9:58 AM 11/15/2014

## 2014-11-15 NOTE — Patient Instructions (Signed)
Health Maintenance Adopting a healthy lifestyle and getting preventive care can go a long way to promote health and wellness. Talk with your health care provider about what schedule of regular examinations is right for you. This is a good chance for you to check in with your provider about disease prevention and staying healthy. In between checkups, there are plenty of things you can do on your own. Experts have done a lot of research about which lifestyle changes and preventive measures are most likely to keep you healthy. Ask your health care provider for more information. WEIGHT AND DIET  Eat a healthy diet  Be sure to include plenty of vegetables, fruits, low-fat dairy products, and lean protein.  Do not eat a lot of foods high in solid fats, added sugars, or salt.  Get regular exercise. This is one of the most important things you can do for your health.  Most adults should exercise for at least 150 minutes each week. The exercise should increase your heart rate and make you sweat (moderate-intensity exercise).  Most adults should also do strengthening exercises at least twice a week. This is in addition to the moderate-intensity exercise.  Maintain a healthy weight  Body mass index (BMI) is a measurement that can be used to identify possible weight problems. It estimates body fat based on height and weight. Your health care provider can help determine your BMI and help you achieve or maintain a healthy weight.  For females 61 years of age and older:   A BMI below 18.5 is considered underweight.  A BMI of 18.5 to 24.9 is normal.  A BMI of 25 to 29.9 is considered overweight.  A BMI of 30 and above is considered obese.  Watch levels of cholesterol and blood lipids  You should start having your blood tested for lipids and cholesterol at 45 years of age, then have this test every 5 years.  You may need to have your cholesterol levels checked more often if:  Your lipid or  cholesterol levels are high.  You are older than 45 years of age.  You are at high risk for heart disease.  CANCER SCREENING   Lung Cancer  Lung cancer screening is recommended for adults 77-19 years old who are at high risk for lung cancer because of a history of smoking.  A yearly low-dose CT scan of the lungs is recommended for people who:  Currently smoke.  Have quit within the past 15 years.  Have at least a 30-pack-year history of smoking. A pack year is smoking an average of one pack of cigarettes a day for 1 year.  Yearly screening should continue until it has been 15 years since you quit.  Yearly screening should stop if you develop a health problem that would prevent you from having lung cancer treatment.  Breast Cancer  Practice breast self-awareness. This means understanding how your breasts normally appear and feel.  It also means doing regular breast self-exams. Let your health care provider know about any changes, no matter how small.  If you are in your 20s or 30s, you should have a clinical breast exam (CBE) by a health care provider every 1-3 years as part of a regular health exam.  If you are 15 or older, have a CBE every year. Also consider having a breast X-ray (mammogram) every year.  If you have a family history of breast cancer, talk to your health care provider about genetic screening.  If you are  at high risk for breast cancer, talk to your health care provider about having an MRI and a mammogram every year.  Breast cancer gene (BRCA) assessment is recommended for women who have family members with BRCA-related cancers. BRCA-related cancers include:  Breast.  Ovarian.  Tubal.  Peritoneal cancers.  Results of the assessment will determine the need for genetic counseling and BRCA1 and BRCA2 testing. Cervical Cancer Routine pelvic examinations to screen for cervical cancer are no longer recommended for nonpregnant women who are considered low  risk for cancer of the pelvic organs (ovaries, uterus, and vagina) and who do not have symptoms. A pelvic examination may be necessary if you have symptoms including those associated with pelvic infections. Ask your health care provider if a screening pelvic exam is right for you.   The Pap test is the screening test for cervical cancer for women who are considered at risk.  If you had a hysterectomy for a problem that was not cancer or a condition that could lead to cancer, then you no longer need Pap tests.  If you are older than 65 years, and you have had normal Pap tests for the past 10 years, you no longer need to have Pap tests.  If you have had past treatment for cervical cancer or a condition that could lead to cancer, you need Pap tests and screening for cancer for at least 20 years after your treatment.  If you no longer get a Pap test, assess your risk factors if they change (such as having a new sexual partner). This can affect whether you should start being screened again.  Some women have medical problems that increase their chance of getting cervical cancer. If this is the case for you, your health care provider may recommend more frequent screening and Pap tests.  The human papillomavirus (HPV) test is another test that may be used for cervical cancer screening. The HPV test looks for the virus that can cause cell changes in the cervix. The cells collected during the Pap test can be tested for HPV.  The HPV test can be used to screen women 30 years of age and older. Getting tested for HPV can extend the interval between normal Pap tests from three to five years.  An HPV test also should be used to screen women of any age who have unclear Pap test results.  After 45 years of age, women should have HPV testing as often as Pap tests.  Colorectal Cancer  This type of cancer can be detected and often prevented.  Routine colorectal cancer screening usually begins at 45 years of  age and continues through 45 years of age.  Your health care provider may recommend screening at an earlier age if you have risk factors for colon cancer.  Your health care provider may also recommend using home test kits to check for hidden blood in the stool.  A small camera at the end of a tube can be used to examine your colon directly (sigmoidoscopy or colonoscopy). This is done to check for the earliest forms of colorectal cancer.  Routine screening usually begins at age 50.  Direct examination of the colon should be repeated every 5-10 years through 45 years of age. However, you may need to be screened more often if early forms of precancerous polyps or small growths are found. Skin Cancer  Check your skin from head to toe regularly.  Tell your health care provider about any new moles or changes in   moles, especially if there is a change in a mole's shape or color.  Also tell your health care provider if you have a mole that is larger than the size of a pencil eraser.  Always use sunscreen. Apply sunscreen liberally and repeatedly throughout the day.  Protect yourself by wearing long sleeves, pants, a wide-brimmed hat, and sunglasses whenever you are outside. HEART DISEASE, DIABETES, AND HIGH BLOOD PRESSURE   Have your blood pressure checked at least every 1-2 years. High blood pressure causes heart disease and increases the risk of stroke.  If you are between 75 years and 42 years old, ask your health care provider if you should take aspirin to prevent strokes.  Have regular diabetes screenings. This involves taking a blood sample to check your fasting blood sugar level.  If you are at a normal weight and have a low risk for diabetes, have this test once every three years after 45 years of age.  If you are overweight and have a high risk for diabetes, consider being tested at a younger age or more often. PREVENTING INFECTION  Hepatitis B  If you have a higher risk for  hepatitis B, you should be screened for this virus. You are considered at high risk for hepatitis B if:  You were born in a country where hepatitis B is common. Ask your health care provider which countries are considered high risk.  Your parents were born in a high-risk country, and you have not been immunized against hepatitis B (hepatitis B vaccine).  You have HIV or AIDS.  You use needles to inject street drugs.  You live with someone who has hepatitis B.  You have had sex with someone who has hepatitis B.  You get hemodialysis treatment.  You take certain medicines for conditions, including cancer, organ transplantation, and autoimmune conditions. Hepatitis C  Blood testing is recommended for:  Everyone born from [birth year] through 1966.  Anyone with known risk factors for hepatitis C. Sexually transmitted infections (STIs)  You should be screened for sexually transmitted infections (STIs) including gonorrhea and chlamydia if:  You are sexually active and are younger than 45 years of age.  You are older than 45 years of age and your health care provider tells you that you are at risk for this type of infection.  Your sexual activity has changed since you were last screened and you are at an increased risk for chlamydia or gonorrhea. Ask your health care provider if you are at risk.  If you do not have HIV, but are at risk, it may be recommended that you take a prescription medicine daily to prevent HIV infection. This is called pre-exposure prophylaxis (PrEP). You are considered at risk if:  You are sexually active and do not regularly use condoms or know the HIV status of your partner(s).  You take drugs by injection.  You are sexually active with a partner who has HIV. Talk with your health care provider about whether you are at high risk of being infected with HIV. If you choose to begin PrEP, you should first be tested for HIV. You should then be tested every 3 months for  as long as you are taking PrEP.  PREGNANCY   If you are premenopausal and you may become pregnant, ask your health care provider about preconception counseling.  If you may become pregnant, take 400 to 800 micrograms (mcg) of folic acid every day.  If you want to prevent pregnancy, talk to your  health care provider about birth control (contraception). OSTEOPOROSIS AND MENOPAUSE   Osteoporosis is a disease in which the bones lose minerals and strength with aging. This can result in serious bone fractures. Your risk for osteoporosis can be identified using a bone density scan.  If you are 65 years of age or older, or if you are at risk for osteoporosis and fractures, ask your health care provider if you should be screened.  Ask your health care provider whether you should take a calcium or vitamin D supplement to lower your risk for osteoporosis.  Menopause may have certain physical symptoms and risks.  Hormone replacement therapy may reduce some of these symptoms and risks. Talk to your health care provider about whether hormone replacement therapy is right for you.  HOME CARE INSTRUCTIONS   Schedule regular health, dental, and eye exams.  Stay current with your immunizations.   Do not use any tobacco products including cigarettes, chewing tobacco, or electronic cigarettes.  If you are pregnant, do not drink alcohol.  If you are breastfeeding, limit how much and how often you drink alcohol.  Limit alcohol intake to no more than 1 drink per day for nonpregnant women. One drink equals 12 ounces of beer, 5 ounces of wine, or 1 ounces of hard liquor.  Do not use street drugs.  Do not share needles.  Ask your health care provider for help if you need support or information about quitting drugs.  Tell your health care provider if you often feel depressed.  Tell your health care provider if you have ever been abused or do not feel safe at home. Document Released: 04/05/2011  Document Revised: 02/04/2014 Document Reviewed: 08/22/2013 Exit[employer] Patient Information 2015 [employer]. This information is not intended to replace advice given to you by your health care provider. Make sure you discuss any questions you have with your health care provider. Levonorgestrel intrauterine device (IUD) What is this medicine? LEVONORGESTREL IUD (LEE voe nor jes trel) is a contraceptive (birth control) device. The device is placed inside the uterus by a healthcare professional. It is used to prevent pregnancy and can also be used to treat heavy bleeding that occurs during your period. Depending on the device, it can be used for 3 to 5 years. This medicine may be used for other purposes; ask your health care provider or pharmacist if you have questions. COMMON BRAND NAME(S): LILETTA, Mirena, Skyla What should I tell my health care provider before I take this medicine? They need to know if you have any of these conditions: -abnormal Pap smear -cancer of the breast, uterus, or cervix -diabetes -endometritis -genital or pelvic infection now or in the past -have more than one sexual partner or your partner has more than one partner -heart disease -history of an ectopic or tubal pregnancy -immune system problems -IUD in place -liver disease or tumor -problems with blood clots or take blood-thinners -use intravenous drugs -uterus of unusual shape -vaginal bleeding that has not been explained -an unusual or allergic reaction to levonorgestrel, other hormones, silicone, or polyethylene, medicines, foods, dyes, or preservatives -pregnant or trying to get pregnant -breast-feeding How should I use this medicine? This device is placed inside the uterus by a health care professional. Talk to your pediatrician regarding the use of this medicine in children. Special care may be needed. Overdosage: If you think you have taken too much of this medicine contact a poison control center or  emergency room at once. NOTE: This medicine is   only for you. Do not share this medicine with others. What if I miss a dose? This does not apply. What may interact with this medicine? Do not take this medicine with any of the following medications: -amprenavir -bosentan -fosamprenavir This medicine may also interact with the following medications: -aprepitant -barbiturate medicines for inducing sleep or treating seizures -bexarotene -griseofulvin -medicines to treat seizures like carbamazepine, ethotoin, felbamate, oxcarbazepine, phenytoin, topiramate -modafinil -pioglitazone -rifabutin -rifampin -rifapentine -some medicines to treat HIV infection like atazanavir, indinavir, lopinavir, nelfinavir, tipranavir, ritonavir -St. John's wort -warfarin This list may not describe all possible interactions. Give your health care provider a list of all the medicines, herbs, non-prescription drugs, or dietary supplements you use. Also tell them if you smoke, drink alcohol, or use illegal drugs. Some items may interact with your medicine. What should I watch for while using this medicine? Visit your doctor or health care professional for regular check ups. See your doctor if you or your partner has sexual contact with others, becomes HIV positive, or gets a sexual transmitted disease. This product does not protect you against HIV infection (AIDS) or other sexually transmitted diseases. You can check the placement of the IUD yourself by reaching up to the top of your vagina with clean fingers to feel the threads. Do not pull on the threads. It is a good habit to check placement after each menstrual period. Call your doctor right away if you feel more of the IUD than just the threads or if you cannot feel the threads at all. The IUD may come out by itself. You may become pregnant if the device comes out. If you notice that the IUD has come out use a backup birth control method like condoms and call your  health care provider. Using tampons will not change the position of the IUD and are okay to use during your period. What side effects may I notice from receiving this medicine? Side effects that you should report to your doctor or health care professional as soon as possible: -allergic reactions like skin rash, itching or hives, swelling of the face, lips, or tongue -fever, flu-like symptoms -genital sores -high blood pressure -no menstrual period for 6 weeks during use -pain, swelling, warmth in the leg -pelvic pain or tenderness -severe or sudden headache -signs of pregnancy -stomach cramping -sudden shortness of breath -trouble with balance, talking, or walking -unusual vaginal bleeding, discharge -yellowing of the eyes or skin Side effects that usually do not require medical attention (report to your doctor or health care professional if they continue or are bothersome): -acne -breast pain -change in sex drive or performance -changes in weight -cramping, dizziness, or faintness while the device is being inserted -headache -irregular menstrual bleeding within first 3 to 6 months of use -nausea This list may not describe all possible side effects. Call your doctor for medical advice about side effects. You may report side effects to FDA at 1-800-FDA-1088. Where should I keep my medicine? This does not apply. NOTE: This sheet is a summary. It may not cover all possible information. If you have questions about this medicine, talk to your doctor, pharmacist, or health care provider.  2015, [employer]. (2011-10-21 13:54:04)

## 2015-01-23 ENCOUNTER — Telehealth: Payer: Self-pay | Admitting: Gynecology

## 2015-01-23 NOTE — Telephone Encounter (Signed)
01/23/15-I LM VM for pt that her BC covers the Mirena & insertion for contraception at 100%, no copay and that TF would insert it while she is on her cycle.wl

## 2015-06-10 ENCOUNTER — Other Ambulatory Visit: Payer: Self-pay

## 2015-06-10 DIAGNOSIS — Z1231 Encounter for screening mammogram for malignant neoplasm of breast: Secondary | ICD-10-CM

## 2015-06-20 ENCOUNTER — Ambulatory Visit
Admission: RE | Admit: 2015-06-20 | Discharge: 2015-06-20 | Disposition: A | Discharge status: Home / Self Care | Payer: BLUE CROSS/BLUE SHIELD | Source: Ambulatory Visit

## 2015-06-20 DIAGNOSIS — Z1231 Encounter for screening mammogram for malignant neoplasm of breast: Secondary | ICD-10-CM

## 2015-11-21 ENCOUNTER — Encounter: Payer: Self-pay | Admitting: Women's Health

## 2015-11-21 ENCOUNTER — Ambulatory Visit (INDEPENDENT_AMBULATORY_CARE_PROVIDER_SITE_OTHER): Payer: BLUE CROSS/BLUE SHIELD | Admitting: Women's Health

## 2015-11-21 VITALS — BP 118/80 | Ht 63.0 in | Wt 147.0 lb

## 2015-11-21 DIAGNOSIS — Z13220 Encounter for screening for lipoid disorders: Secondary | ICD-10-CM

## 2015-11-21 DIAGNOSIS — R140 Abdominal distension (gaseous): Secondary | ICD-10-CM | POA: Diagnosis not present

## 2015-11-21 DIAGNOSIS — Z01419 Encounter for gynecological examination (general) (routine) without abnormal findings: Secondary | ICD-10-CM

## 2015-11-21 DIAGNOSIS — F4323 Adjustment disorder with mixed anxiety and depressed mood: Secondary | ICD-10-CM | POA: Diagnosis not present

## 2015-11-21 DIAGNOSIS — Z1329 Encounter for screening for other suspected endocrine disorder: Secondary | ICD-10-CM

## 2015-11-21 DIAGNOSIS — L708 Other acne: Secondary | ICD-10-CM

## 2015-11-21 LAB — CBC WITH DIFFERENTIAL/PLATELET
BASOS PCT: 0 % (ref 0–1)
Basophils Absolute: 0 10*3/uL (ref 0.0–0.1)
EOS ABS: 0.2 10*3/uL (ref 0.0–0.7)
Eosinophils Relative: 2 % (ref 0–5)
HCT: 42.5 % (ref 36.0–46.0)
Hemoglobin: 14.2 g/dL (ref 12.0–15.0)
Lymphocytes Relative: 26 % (ref 12–46)
Lymphs Abs: 2.1 10*3/uL (ref 0.7–4.0)
MCH: 32.6 pg (ref 26.0–34.0)
MCHC: 33.4 g/dL (ref 30.0–36.0)
MCV: 97.7 fL (ref 78.0–100.0)
MONOS PCT: 6 % (ref 3–12)
MPV: 9 fL (ref 8.6–12.4)
Monocytes Absolute: 0.5 10*3/uL (ref 0.1–1.0)
Neutro Abs: 5.2 10*3/uL (ref 1.7–7.7)
Neutrophils Relative %: 66 % (ref 43–77)
PLATELETS: 274 10*3/uL (ref 150–400)
RBC: 4.35 MIL/uL (ref 3.87–5.11)
RDW: 13.3 % (ref 11.5–15.5)
WBC: 7.9 10*3/uL (ref 4.0–10.5)

## 2015-11-21 LAB — COMPREHENSIVE METABOLIC PANEL
ALK PHOS: 55 U/L (ref 33–115)
ALT: 15 U/L (ref 6–29)
AST: 15 U/L (ref 10–35)
Albumin: 4 g/dL (ref 3.6–5.1)
BILIRUBIN TOTAL: 0.7 mg/dL (ref 0.2–1.2)
BUN: 14 mg/dL (ref 7–25)
CALCIUM: 8.9 mg/dL (ref 8.6–10.2)
CO2: 24 mmol/L (ref 20–31)
Chloride: 105 mmol/L (ref 98–110)
Creat: 0.92 mg/dL (ref 0.50–1.10)
GLUCOSE: 88 mg/dL (ref 65–99)
POTASSIUM: 4 mmol/L (ref 3.5–5.3)
Sodium: 137 mmol/L (ref 135–146)
Total Protein: 6.4 g/dL (ref 6.1–8.1)

## 2015-11-21 LAB — TSH: TSH: 1.44 m[IU]/L

## 2015-11-21 LAB — LIPID PANEL
CHOL/HDL RATIO: 3.4 ratio (ref <=5.0)
Cholesterol: 179 mg/dL (ref 125–200)
HDL: 53 mg/dL (ref >=46)
LDL CALC: 108 mg/dL (ref <130)
Triglycerides: 89 mg/dL (ref <150)
VLDL: 18 mg/dL (ref <30)

## 2015-11-21 MED ORDER — MINOCYCLINE HCL 50 MG PO TABS: 50.0000 mg | ORAL_TABLET | ORAL | Status: DC

## 2015-11-21 MED ORDER — BUPROPION HCL ER (XL) 300 MG PO TB24: 300.0000 mg | ORAL_TABLET | Freq: Every day | ORAL | Status: DC

## 2015-11-21 NOTE — Patient Instructions (Signed)
Health Maintenance, Female Adopting a healthy lifestyle and getting preventive care can go a long way to promote health and wellness. Talk with your health care provider about what schedule of regular examinations is right for you. This is a good chance for you to check in with your provider about disease prevention and staying healthy. In between checkups, there are plenty of things you can do on your own. Experts have done a lot of research about which lifestyle changes and preventive measures are most likely to keep you healthy. Ask your health care provider for more information. WEIGHT AND DIET  Eat a healthy diet  Be sure to include plenty of vegetables, fruits, low-fat dairy products, and lean protein.  Do not eat a lot of foods high in solid fats, added sugars, or salt.  Get regular exercise. This is one of the most important things you can do for your health.  Most adults should exercise for at least 150 minutes each week. The exercise should increase your heart rate and make you sweat (moderate-intensity exercise).  Most adults should also do strengthening exercises at least twice a week. This is in addition to the moderate-intensity exercise.  Maintain a healthy weight  Body mass index (BMI) is a measurement that can be used to identify possible weight problems. It estimates body fat based on height and weight. Your health care provider can help determine your BMI and help you achieve or maintain a healthy weight.  For females 20 years of age and older:   A BMI below 18.5 is considered underweight.  A BMI of 18.5 to 24.9 is normal.  A BMI of 25 to 29.9 is considered overweight.  A BMI of 30 and above is considered obese.  Watch levels of cholesterol and blood lipids  You should start having your blood tested for lipids and cholesterol at 46 years of age, then have this test every 5 years.  You may need to have your cholesterol levels checked more often if:  Your lipid  or cholesterol levels are high.  You are older than 46 years of age.  You are at high risk for heart disease.  CANCER SCREENING   Lung Cancer  Lung cancer screening is recommended for adults 55-80 years old who are at high risk for lung cancer because of a history of smoking.  A yearly low-dose CT scan of the lungs is recommended for people who:  Currently smoke.  Have quit within the past 15 years.  Have at least a 30-pack-year history of smoking. A pack year is smoking an average of one pack of cigarettes a day for 1 year.  Yearly screening should continue until it has been 15 years since you quit.  Yearly screening should stop if you develop a health problem that would prevent you from having lung cancer treatment.  Breast Cancer  Practice breast self-awareness. This means understanding how your breasts normally appear and feel.  It also means doing regular breast self-exams. Let your health care provider know about any changes, no matter how small.  If you are in your 20s or 30s, you should have a clinical breast exam (CBE) by a health care provider every 1-3 years as part of a regular health exam.  If you are 40 or older, have a CBE every year. Also consider having a breast X-ray (mammogram) every year.  If you have a family history of breast cancer, talk to your health care provider about genetic screening.  If you   are at high risk for breast cancer, talk to your health care provider about having an MRI and a mammogram every year.  Breast cancer gene (BRCA) assessment is recommended for women who have family members with BRCA-related cancers. BRCA-related cancers include:  Breast.  Ovarian.  Tubal.  Peritoneal cancers.  Results of the assessment will determine the need for genetic counseling and BRCA1 and BRCA2 testing. Cervical Cancer Your health care provider may recommend that you be screened regularly for cancer of the pelvic organs (ovaries, uterus, and  vagina). This screening involves a pelvic examination, including checking for microscopic changes to the surface of your cervix (Pap test). You may be encouraged to have this screening done every 3 years, beginning at age 21.  For women ages 30-65, health care providers may recommend pelvic exams and Pap testing every 3 years, or they may recommend the Pap and pelvic exam, combined with testing for human papilloma virus (HPV), every 5 years. Some types of HPV increase your risk of cervical cancer. Testing for HPV may also be done on women of any age with unclear Pap test results.  Other health care providers may not recommend any screening for nonpregnant women who are considered low risk for pelvic cancer and who do not have symptoms. Ask your health care provider if a screening pelvic exam is right for you.  If you have had past treatment for cervical cancer or a condition that could lead to cancer, you need Pap tests and screening for cancer for at least 20 years after your treatment. If Pap tests have been discontinued, your risk factors (such as having a new sexual partner) need to be reassessed to determine if screening should resume. Some women have medical problems that increase the chance of getting cervical cancer. In these cases, your health care provider may recommend more frequent screening and Pap tests. Colorectal Cancer  This type of cancer can be detected and often prevented.  Routine colorectal cancer screening usually begins at 46 years of age and continues through 46 years of age.  Your health care provider may recommend screening at an earlier age if you have risk factors for colon cancer.  Your health care provider may also recommend using home test kits to check for hidden blood in the stool.  A small camera at the end of a tube can be used to examine your colon directly (sigmoidoscopy or colonoscopy). This is done to check for the earliest forms of colorectal  cancer.  Routine screening usually begins at age 50.  Direct examination of the colon should be repeated every 5-10 years through 46 years of age. However, you may need to be screened more often if early forms of precancerous polyps or small growths are found. Skin Cancer  Check your skin from head to toe regularly.  Tell your health care provider about any new moles or changes in moles, especially if there is a change in a mole's shape or color.  Also tell your health care provider if you have a mole that is larger than the size of a pencil eraser.  Always use sunscreen. Apply sunscreen liberally and repeatedly throughout the day.  Protect yourself by wearing long sleeves, pants, a wide-brimmed hat, and sunglasses whenever you are outside. HEART DISEASE, DIABETES, AND HIGH BLOOD PRESSURE   High blood pressure causes heart disease and increases the risk of stroke. High blood pressure is more likely to develop in:  People who have blood pressure in the high end   of the normal range (130-139/85-89 mm Hg).  People who are overweight or obese.  People who are African American.  If you are 38-23 years of age, have your blood pressure checked every 3-5 years. If you are 61 years of age or older, have your blood pressure checked every year. You should have your blood pressure measured twice--once when you are at a hospital or clinic, and once when you are not at a hospital or clinic. Record the average of the two measurements. To check your blood pressure when you are not at a hospital or clinic, you can use:  An automated blood pressure machine at a pharmacy.  A home blood pressure monitor.  If you are between 45 years and 39 years old, ask your health care provider if you should take aspirin to prevent strokes.  Have regular diabetes screenings. This involves taking a blood sample to check your fasting blood sugar level.  If you are at a normal weight and have a low risk for diabetes,  have this test once every three years after 46 years of age.  If you are overweight and have a high risk for diabetes, consider being tested at a younger age or more often. PREVENTING INFECTION  Hepatitis B  If you have a higher risk for hepatitis B, you should be screened for this virus. You are considered at high risk for hepatitis B if:  You were born in a country where hepatitis B is common. Ask your health care provider which countries are considered high risk.  Your parents were born in a high-risk country, and you have not been immunized against hepatitis B (hepatitis B vaccine).  You have HIV or AIDS.  You use needles to inject street drugs.  You live with someone who has hepatitis B.  You have had sex with someone who has hepatitis B.  You get hemodialysis treatment.  You take certain medicines for conditions, including cancer, organ transplantation, and autoimmune conditions. Hepatitis C  Blood testing is recommended for:  Everyone born from [birth year] through 1966.  Anyone with known risk factors for hepatitis C. Sexually transmitted infections (STIs)  You should be screened for sexually transmitted infections (STIs) including gonorrhea and chlamydia if:  You are sexually active and are younger than 46 years of age.  You are older than 46 years of age and your health care provider tells you that you are at risk for this type of infection.  Your sexual activity has changed since you were last screened and you are at an increased risk for chlamydia or gonorrhea. Ask your health care provider if you are at risk.  If you do not have HIV, but are at risk, it may be recommended that you take a prescription medicine daily to prevent HIV infection. This is called pre-exposure prophylaxis (PrEP). You are considered at risk if:  You are sexually active and do not regularly use condoms or know the HIV status of your partner(s).  You take drugs by injection.  You are sexually  active with a partner who has HIV. Talk with your health care provider about whether you are at high risk of being infected with HIV. If you choose to begin PrEP, you should first be tested for HIV. You should then be tested every 3 months for as long as you are taking PrEP.  PREGNANCY   If you are premenopausal and you may become pregnant, ask your health care provider about preconception counseling.  If you may  become pregnant, take 400 to 800 micrograms (mcg) of folic acid every day.  If you want to prevent pregnancy, talk to your health care provider about birth control (contraception). OSTEOPOROSIS AND MENOPAUSE   Osteoporosis is a disease in which the bones lose minerals and strength with aging. This can result in serious bone fractures. Your risk for osteoporosis can be identified using a bone density scan.  If you are 61 years of age or older, or if you are at risk for osteoporosis and fractures, ask your health care provider if you should be screened.  Ask your health care provider whether you should take a calcium or vitamin D supplement to lower your risk for osteoporosis.  Menopause may have certain physical symptoms and risks.  Hormone replacement therapy may reduce some of these symptoms and risks. Talk to your health care provider about whether hormone replacement therapy is right for you.  HOME CARE INSTRUCTIONS   Schedule regular health, dental, and eye exams.  Stay current with your immunizations.   Do not use any tobacco products including cigarettes, chewing tobacco, or electronic cigarettes.  If you are pregnant, do not drink alcohol.  If you are breastfeeding, limit how much and how often you drink alcohol.  Limit alcohol intake to no more than 1 drink per day for nonpregnant women. One drink equals 12 ounces of beer, 5 ounces of wine, or 1 ounces of hard liquor.  Do not use street drugs.  Do not share needles.  Ask your health care provider for help if  you need support or information about quitting drugs.  Tell your health care provider if you often feel depressed.  Tell your health care provider if you have ever been abused or do not feel safe at home.   This information is not intended to replace advice given to you by your health care provider. Make sure you discuss any questions you have with your health care provider.   Document Released: 04/05/2011 Document Revised: 10/11/2014 Document Reviewed: 08/22/2013 Elsevier Interactive Patient Education [employer].

## 2015-11-21 NOTE — Progress Notes (Signed)
Katelyn Delgado March 06, 1971 [id number]    History:    Presents for annual exam.  Regular monthly cycle/condoms. Requesting Mirena. Normal Pap and mammogram history. History of depression doing well on Wellbutrin.  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 Mother hypertension, maternal aunt maternal grandmother breast cancer.  ROS:  A ROS was performed and pertinent positives and negatives are included.  Exam:  Filed Vitals:   11/21/15 0853  BP: 118/80    General appearance:  Normal Thyroid:  Symmetrical, normal in size, without palpable masses or nodularity. Respiratory  Auscultation:  Clear without wheezing or rhonchi Cardiovascular  Auscultation:  Regular rate, without rubs, murmurs or gallops  Edema/varicosities:  Not grossly evident Abdominal  Soft,nontender, without masses, guarding or rebound.  Liver/spleen:  No organomegaly noted  Hernia:  None appreciated  Skin  Inspection:  Grossly normal   Breasts: Examined lying and sitting.     Right: Without masses, retractions, discharge or axillary adenopathy.     Left: Without masses, retractions, discharge or axillary adenopathy. Gentitourinary   Inguinal/mons:  Normal without inguinal adenopathy  External genitalia:  Normal  BUS/Urethra/Skene's glands:  Normal  Vagina:  Normal  Cervix:  Normal  Uterus:   normal in size, shape and contour.  Midline and mobile  Adnexa/parametria:     Rt: Without masses or tenderness.   Lt: Without masses or tenderness.  Anus and perineum: Normal  Digital rectal exam: Normal sphincter tone without palpated masses or tenderness  Assessment/Plan:  45 y.o. MWF G1 P1  for annual exam with no complaints.  Monthly cycle/condoms Contraception management Depression stable on Wellbutrin Acne minocycline for years IBS/bloating  Plan: Contraception options reviewed  Mirena information given and reviewed slight risk for infection, perforation and hemorrhage, will schedule with Dr. Phineas Real with next cycle. Covered by insurance. SBE's, continue annual 3-D screening mammogram history of dense breasts. Exercise, calcium rich diet, vitamin D 1000 daily encouraged. Wellbutrin 300 XL prescription, proper use given and reviewed, reviewed importance of leisure activities and regular exercise. Counseling as needed. Minocycline 50 mg by mouth daily, prescription given will follow-up with dermatologist. CBC, CMP, lipid panel, TSH, UA, Pap normal 2015 with negative HR HPV typing, new screening guidelines reviewed. Will try dietary changes if continued bloating type symptoms will schedule ultrasound.  Huel Cote [facility], 9:53 AM 11/21/2015

## 2015-11-22 LAB — URINALYSIS W MICROSCOPIC + REFLEX CULTURE
BILIRUBIN URINE: NEGATIVE
Bacteria, UA: NONE SEEN [HPF]
CRYSTALS: NONE SEEN [HPF]
Casts: NONE SEEN [LPF]
Glucose, UA: NEGATIVE
Ketones, ur: NEGATIVE
LEUKOCYTES UA: NEGATIVE
NITRITE: NEGATIVE
PH: 6 (ref 5.0–8.0)
Protein, ur: NEGATIVE
SPECIFIC GRAVITY, URINE: 1.015 (ref 1.001–1.035)
WBC UA: NONE SEEN WBC/HPF (ref <=5)
Yeast: NONE SEEN [HPF]

## 2015-11-23 LAB — URINE CULTURE
COLONY COUNT: NO GROWTH
ORGANISM ID, BACTERIA: NO GROWTH

## 2015-11-24 ENCOUNTER — Telehealth: Payer: Self-pay | Admitting: Gynecology

## 2015-11-24 NOTE — Telephone Encounter (Signed)
11/24/15-I LM VM home that pt BC ins will cover the Mirena for contraception at 100%, no copay. Per Al@BC -Ref#[id number] Tf to insert while on cycle.Pt to call if she wishes to proceed.

## 2015-11-28 ENCOUNTER — Encounter: Payer: Self-pay | Admitting: Gynecology

## 2015-11-28 ENCOUNTER — Ambulatory Visit (INDEPENDENT_AMBULATORY_CARE_PROVIDER_SITE_OTHER): Payer: BLUE CROSS/BLUE SHIELD | Admitting: Gynecology

## 2015-11-28 VITALS — BP 118/74

## 2015-11-28 DIAGNOSIS — Z30430 Encounter for insertion of intrauterine contraceptive device: Secondary | ICD-10-CM

## 2015-11-28 HISTORY — PX: INTRAUTERINE DEVICE INSERTION: SHX323

## 2015-11-28 NOTE — Patient Instructions (Signed)
Intrauterine Device Insertion Most often, an intrauterine device (IUD) is inserted into the uterus to prevent pregnancy. There are 2 types of IUDs available:  Copper IUD--This type of IUD creates an environment that is not favorable to sperm survival. The mechanism of action of the copper IUD is not known for certain. It can stay in place for 10 years.  Hormone IUD--This type of IUD contains the hormone progestin (synthetic progesterone). The progestin thickens the cervical mucus and prevents sperm from entering the uterus, and it also thins the uterine lining. There is no evidence that the hormone IUD prevents implantation. One hormone IUD can stay in place for up to 5 years, and a different hormone IUD can stay in place for up to 3 years. An IUD is the most cost-effective birth control if left in place for the full duration. It may be removed at any time. LET YOUR HEALTH CARE PROVIDER KNOW ABOUT:  Any allergies you have.  All medicines you are taking, including vitamins, herbs, eye drops, creams, and over-the-counter medicines.  Previous problems you or members of your family have had with the use of anesthetics.  Any blood disorders you have.  Previous surgeries you have had.  Possibility of pregnancy.  Medical conditions you have. RISKS AND COMPLICATIONS  Generally, intrauterine device insertion is a safe procedure. However, as with any procedure, complications can occur. Possible complications include:  Accidental puncture (perforation) of the uterus.  Accidental placement of the IUD either in the muscle layer of the uterus (myometrium) or outside the uterus. If this happens, the IUD can be found essentially floating around the bowels and must be taken out surgically.  The IUD may fall out of the uterus (expulsion). This is more common in women who have recently had a child.   Pregnancy in the fallopian tube (ectopic).  Pelvic inflammatory disease (PID), which is infection of  the uterus and fallopian tubes. The risk of PID is slightly increased in the first 20 days after the IUD is placed, but the overall risk is still very low. BEFORE THE PROCEDURE  Schedule the IUD insertion for when you will have your menstrual period or right after, to make sure you are not pregnant. Placement of the IUD is better tolerated shortly after a menstrual cycle.  You may need to take tests or be examined to make sure you are not pregnant.  You may be required to take a pregnancy test.  You may be required to get checked for sexually transmitted infections (STIs) prior to placement. Placing an IUD in someone who has an infection can make the infection worse.  You may be given a pain reliever to take 1 or 2 hours before the procedure.  An exam will be performed to determine the size and position of your uterus.  Ask your health care provider about changing or stopping your regular medicines. PROCEDURE   A tool (speculum) is placed in the vagina. This allows your health care provider to see the lower part of the uterus (cervix).  The cervix is prepped with a medicine that lowers the risk of infection.  You may be given a medicine to numb each side of the cervix (intracervical or paracervical block). This is used to block and control any discomfort with insertion.  A tool (uterine sound) is inserted into the uterus to determine the length of the uterine cavity and the direction the uterus may be tilted.  A slim instrument (IUD inserter) is inserted through the cervical   canal and into your uterus.  The IUD is placed in the uterine cavity and the insertion device is removed.  The nylon string that is attached to the IUD and used for eventual IUD removal is trimmed. It is trimmed so that it lays high in the vagina, just outside the cervix. AFTER THE PROCEDURE  You may have bleeding after the procedure. This is normal. It varies from light spotting for a few days to menstrual-like  bleeding.  You may have mild cramping.   This information is not intended to replace advice given to you by your health care provider. Make sure you discuss any questions you have with your health care provider.   Document Released: 05/19/2011 Document Revised: 07/11/2013 Document Reviewed: 03/11/2013 Elsevier Interactive Patient Education 2016 Elsevier Inc.  

## 2015-11-28 NOTE — Progress Notes (Signed)
Patient presents for Mirena IUD placement. She has read through the booklet, has no contraindications and signed the consent form. She currently is on a normal menses.  I reviewed the insertional process with her as well as the risks to include infection, either immediate or long-term, uterine perforation or migration requiring surgery to remove, other complications such as pain, hormonal side effects and possibility of failure with subsequent pregnancy.   Exam with Caryn Bee assistant Pelvic: External BUS vagina normal. Cervix normal with menses flow. Uterus anteverted normal size shape contour midline mobile nontender. Adnexa without masses or tenderness.  Procedure: The cervix was cleansed with Betadine, anterior lip grasped with a single-tooth tenaculum, the uterus was sounded and a Mirena IUD was placed according to manufacturer's recommendations without difficulty. The strings were trimmed. The patient tolerated well and will follow up in one month for a postinsertional check.  Lot number:  [id number]    Anastasio Auerbach MD, 12:49 PM 11/28/2015

## 2015-12-01 ENCOUNTER — Encounter: Payer: Self-pay | Admitting: Gynecology

## 2015-12-01 ENCOUNTER — Telehealth: Payer: Self-pay | Admitting: *Deleted

## 2015-12-01 NOTE — Telephone Encounter (Signed)
Pt called had Mirena IUD placed on OV 11/28/15 called and left message in my voicemail c/o bleeding and cramping asking if normal. I spoke with patient and told her not abnormal to have spotting or bleeding as long as not heavy changing tampon or pad every hour or cramping pain level not a 8 or higher. Pt said neither of the above was happening, pt will watch for now and call to follow up. I also explained to pt if any of the above should happen after hours to be seen at ER. Pt verbalized she understood.

## 2015-12-08 ENCOUNTER — Telehealth: Payer: Self-pay | Admitting: *Deleted

## 2015-12-08 NOTE — Telephone Encounter (Signed)
Pt informed with the below note. 

## 2015-12-08 NOTE — Telephone Encounter (Signed)
Pt has Mirena IUD placed on 11/28/15 c/o cramping after sexual intercourse on Saturday , rates cramping 2 out of 10 pain level, take Advil for but doesn't want to take daily. Also mention a nagging cramping on lower right quadrant. Pt states she is not in pain, but the cramping is more so annoying then anything, asked how long this should go on? Please advise

## 2015-12-08 NOTE — Telephone Encounter (Signed)
Sometimes it takes about a month for the IUD to "settle in". Hopefully all of these symptoms will resolve of the next week or 2.

## 2015-12-17 ENCOUNTER — Other Ambulatory Visit: Payer: Self-pay

## 2015-12-17 DIAGNOSIS — F4323 Adjustment disorder with mixed anxiety and depressed mood: Secondary | ICD-10-CM

## 2015-12-17 MED ORDER — BUPROPION HCL ER (XL) 300 MG PO TB24: 300.0000 mg | ORAL_TABLET | Freq: Every day | ORAL | Status: DC

## 2015-12-18 ENCOUNTER — Other Ambulatory Visit: Payer: Self-pay

## 2015-12-18 DIAGNOSIS — F4323 Adjustment disorder with mixed anxiety and depressed mood: Secondary | ICD-10-CM

## 2015-12-18 MED ORDER — BUPROPION HCL ER (XL) 300 MG PO TB24: 300.0000 mg | ORAL_TABLET | Freq: Every day | ORAL | Status: DC

## 2015-12-26 ENCOUNTER — Encounter: Payer: Self-pay | Admitting: Gynecology

## 2015-12-26 ENCOUNTER — Ambulatory Visit (INDEPENDENT_AMBULATORY_CARE_PROVIDER_SITE_OTHER): Payer: BLUE CROSS/BLUE SHIELD | Admitting: Gynecology

## 2015-12-26 VITALS — BP 118/74

## 2015-12-26 DIAGNOSIS — Z30431 Encounter for routine checking of intrauterine contraceptive device: Secondary | ICD-10-CM | POA: Diagnosis not present

## 2015-12-26 DIAGNOSIS — R140 Abdominal distension (gaseous): Secondary | ICD-10-CM | POA: Diagnosis not present

## 2015-12-26 MED ORDER — MEGESTROL ACETATE 40 MG PO TABS: 40.0000 mg | ORAL_TABLET | Freq: Every day | ORAL | Status: DC

## 2015-12-26 NOTE — Patient Instructions (Signed)
Take the progesterone pill daily for 2 weeks  Follow up for ultrasound as scheduled

## 2015-12-26 NOTE — Progress Notes (Signed)
    Katelyn Delgado 1971-04-04 [id number]        45 y.o.  G1P0101 Presents for IUD follow up exam. Had Mirena IUD placed 1 month ago. Has had bleeding and cramping every day since. Also having a lot of abdominal bloating which she has had for several months. Has tried probiotics without any benefit. At talk to [place] about this previously. No nausea vomiting diarrhea constipation.  Past medical history,surgical history, problem list, medications, allergies, family history and social history were all reviewed and documented in the EPIC chart.  Directed ROS with pertinent positives and negatives documented in the history of present illness/assessment and plan.  Exam: Caryn Bee assistant Filed Vitals:   12/26/15 0848  BP: 118/74   General appearance:  Normal Abdomen soft nontender without masses guarding rebound Pelvic external BUS vagina with slight blood staining. Cervix normal with IUD string visualized. Uterus normal size midline mobile nontender. Adnexa without masses or tenderness.  Assessment/Plan:  45 y.o. G1P0101 with bleeding and cramping since placement of Mirena IUD. IUD string visualized. Reviewed differential with the patient to include possible malalignment of the IUD. Will treat with Megace 20 mg daily for 2 weeks to see if we can't thin the endometrium and eliminated her bleeding. Also recommended follow up ultrasound due to her bloating to rule out GYN pathology and we can also check the IUD for position. She'll schedule this in several weeks after she completes her progesterone medication. If IUD in proper location, bleeding resolves but her bloating continues then will refer to GI for further evaluation.    Anastasio Auerbach MD, 9:02 AM 12/26/2015

## 2016-01-14 ENCOUNTER — Ambulatory Visit (INDEPENDENT_AMBULATORY_CARE_PROVIDER_SITE_OTHER): Payer: BLUE CROSS/BLUE SHIELD

## 2016-01-14 ENCOUNTER — Other Ambulatory Visit: Payer: Self-pay | Admitting: Gynecology

## 2016-01-14 ENCOUNTER — Encounter: Payer: Self-pay | Admitting: Gynecology

## 2016-01-14 ENCOUNTER — Ambulatory Visit (INDEPENDENT_AMBULATORY_CARE_PROVIDER_SITE_OTHER): Payer: BLUE CROSS/BLUE SHIELD | Admitting: Gynecology

## 2016-01-14 VITALS — BP 116/76

## 2016-01-14 DIAGNOSIS — N921 Excessive and frequent menstruation with irregular cycle: Secondary | ICD-10-CM | POA: Diagnosis not present

## 2016-01-14 DIAGNOSIS — R140 Abdominal distension (gaseous): Secondary | ICD-10-CM

## 2016-01-14 DIAGNOSIS — Z975 Presence of (intrauterine) contraceptive device: Secondary | ICD-10-CM

## 2016-01-14 DIAGNOSIS — D251 Intramural leiomyoma of uterus: Secondary | ICD-10-CM | POA: Diagnosis not present

## 2016-01-14 DIAGNOSIS — N926 Irregular menstruation, unspecified: Secondary | ICD-10-CM | POA: Diagnosis not present

## 2016-01-14 DIAGNOSIS — D252 Subserosal leiomyoma of uterus: Secondary | ICD-10-CM

## 2016-01-14 NOTE — Patient Instructions (Signed)
Follow up for your annual exam in February 2018, sooner if your irregular bleeding continues

## 2016-01-14 NOTE — Progress Notes (Signed)
    Katelyn Delgado [birth year] [id number]        45 y.o.  G1P0101 Presents for ultrasound due to irregular bleeding with IUD. She actually notes that her bleeding has lessened to just occasional spotting.  Past medical history,surgical history, problem list, medications, allergies, family history and social history were all reviewed and documented in the EPIC chart.  Directed ROS with pertinent positives and negatives documented in the history of present illness/assessment and plan.  Exam: Filed Vitals:   01/14/16 0930  BP: 116/76   General appearance:  Normal  Ultrasound shows uterus normal size. 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 Endometrial echo 3.4 mm. IUD in normal position.  Right and left ovaries visualized and normal.  Cul-de-sac negative   Assessment/Plan:  45 y.o. G1P0101 with ultrasound showing IUD in normal position. 2 small myomas noted and discussed with the patient. Occasional spotting now. Patient will keep bleeding calendar as long as results that we'll follow expectantly with annual exam in February 2018. If irregular bleeding returns then patient will follow up sooner.    Anastasio Auerbach MD, 9:47 AM 01/14/2016

## 2016-02-09 ENCOUNTER — Telehealth: Payer: Self-pay | Admitting: *Deleted

## 2016-02-09 MED ORDER — MEDROXYPROGESTERONE ACETATE 10 MG PO TABS: 10.0000 mg | ORAL_TABLET | Freq: Every day | ORAL | Status: DC

## 2016-02-09 NOTE — Telephone Encounter (Signed)
Pt called to follow up from [place] 01/14/16 was told to call if spotting, pt has been spotting now for 2 weeks now. Please advise

## 2016-02-09 NOTE — Telephone Encounter (Signed)
Recommend short course of progesterone to see if we cannot make this go away. Provera 10 mg daily 10 days

## 2016-02-09 NOTE — Telephone Encounter (Signed)
Left the below on pt voicemail, rx sent.

## 2016-05-11 ENCOUNTER — Telehealth: Payer: Self-pay | Admitting: *Deleted

## 2016-05-11 MED ORDER — MINOCYCLINE HCL 50 MG PO CAPS
ORAL_CAPSULE | ORAL | 4 refills | Status: DC
Start: 2016-05-11 — End: 2016-11-26

## 2016-05-11 NOTE — Telephone Encounter (Signed)
Pharmacy faxed stating minocycline 50 tablets is not on formulary, but  minocycline 50 mg capsules are cover. The capsules will be sent to pharmacy for pt can pick up.

## 2016-10-05 ENCOUNTER — Other Ambulatory Visit: Payer: Self-pay | Admitting: Women's Health

## 2016-10-05 DIAGNOSIS — Z1231 Encounter for screening mammogram for malignant neoplasm of breast: Secondary | ICD-10-CM

## 2016-10-22 ENCOUNTER — Ambulatory Visit: Payer: BLUE CROSS/BLUE SHIELD

## 2016-10-22 ENCOUNTER — Ambulatory Visit
Admission: RE | Admit: 2016-10-22 | Discharge: 2016-10-22 | Disposition: A | Discharge status: Home / Self Care | Payer: 59 | Source: Ambulatory Visit | Attending: Women's Health | Admitting: Women's Health

## 2016-10-22 DIAGNOSIS — Z1231 Encounter for screening mammogram for malignant neoplasm of breast: Secondary | ICD-10-CM

## 2016-11-26 ENCOUNTER — Ambulatory Visit (INDEPENDENT_AMBULATORY_CARE_PROVIDER_SITE_OTHER): Payer: 59 | Admitting: Women's Health

## 2016-11-26 ENCOUNTER — Encounter: Payer: Self-pay | Admitting: Women's Health

## 2016-11-26 VITALS — BP 118/72 | Ht 63.0 in | Wt 151.0 lb

## 2016-11-26 DIAGNOSIS — F4323 Adjustment disorder with mixed anxiety and depressed mood: Secondary | ICD-10-CM

## 2016-11-26 DIAGNOSIS — Z1151 Encounter for screening for human papillomavirus (HPV): Secondary | ICD-10-CM | POA: Diagnosis not present

## 2016-11-26 DIAGNOSIS — Z01419 Encounter for gynecological examination (general) (routine) without abnormal findings: Secondary | ICD-10-CM | POA: Diagnosis not present

## 2016-11-26 DIAGNOSIS — L703 Acne tropica: Secondary | ICD-10-CM | POA: Diagnosis not present

## 2016-11-26 LAB — CBC WITH DIFFERENTIAL/PLATELET
Basophils Absolute: 0 cells/uL (ref 0–200)
Basophils Relative: 0 %
Eosinophils Absolute: 87 cells/uL (ref 15–500)
Eosinophils Relative: 1 %
HEMATOCRIT: 39.7 % (ref 35.0–45.0)
Hemoglobin: 13.2 g/dL (ref 11.7–15.5)
LYMPHS PCT: 19 %
Lymphs Abs: 1653 cells/uL (ref 850–3900)
MCH: 32.4 pg (ref 27.0–33.0)
MCHC: 33.2 g/dL (ref 32.0–36.0)
MCV: 97.5 fL (ref 80.0–100.0)
MONO ABS: 609 {cells}/uL (ref 200–950)
MPV: 8.9 fL (ref 7.5–12.5)
Monocytes Relative: 7 %
NEUTROS PCT: 73 %
Neutro Abs: 6351 cells/uL (ref 1500–7800)
Platelets: 275 10*3/uL (ref 140–400)
RBC: 4.07 MIL/uL (ref 3.80–5.10)
RDW: 12.5 % (ref 11.0–15.0)
WBC: 8.7 10*3/uL (ref 3.8–10.8)

## 2016-11-26 LAB — URINALYSIS W MICROSCOPIC + REFLEX CULTURE
Bacteria, UA: NONE SEEN [HPF]
Bilirubin Urine: NEGATIVE
Casts: NONE SEEN [LPF]
Crystals: NONE SEEN [HPF]
Glucose, UA: NEGATIVE
Ketones, ur: NEGATIVE
Leukocytes, UA: NEGATIVE
NITRITE: NEGATIVE
Protein, ur: NEGATIVE
Specific Gravity, Urine: 1.017 (ref 1.001–1.035)
YEAST: NONE SEEN [HPF]
pH: 5.5 (ref 5.0–8.0)

## 2016-11-26 LAB — GLUCOSE, RANDOM: Glucose, Bld: 87 mg/dL (ref 65–99)

## 2016-11-26 MED ORDER — BUPROPION HCL ER (XL) 300 MG PO TB24: 300.0000 mg | ORAL_TABLET | Freq: Every day | ORAL | 10 refills | Status: DC

## 2016-11-26 MED ORDER — BUPROPION HCL ER (XL) 300 MG PO TB24: 300.0000 mg | ORAL_TABLET | Freq: Every day | ORAL | 4 refills | Status: DC

## 2016-11-26 MED ORDER — MINOCYCLINE HCL 50 MG PO CAPS: ORAL_CAPSULE | ORAL | 4 refills | Status: DC

## 2016-11-26 NOTE — Patient Instructions (Signed)

## 2016-11-26 NOTE — Progress Notes (Signed)
Tamela Laffey 12-Jul-1971 [id number]    History:    Presents for annual exam.  11/2015 Mirena IUD rare bleeding. 01/2016 IUD for proper position verified. History of depression doing well on Wellbutrin and desires to continue. Quit smoking January 2018. Normal Pap and mammogram history.  Past medical history, past surgical history, family history and social history were all reviewed and documented in the EPIC chart. 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 Mother hypertension, maternal aunt and maternal grandmother breast cancer survivors.  ROS:  A ROS was performed and pertinent positives and negatives are included.  Exam:  Vitals:   11/26/16 0809  BP: 118/72  Weight: 151 lb (68.5 kg)  Height: 5\' 3"  (1.6 m)   Body mass index is 26.75 kg/m.   General appearance:  Normal Thyroid:  Symmetrical, normal in size, without palpable masses or nodularity. Respiratory  Auscultation:  Clear without wheezing or rhonchi Cardiovascular  Auscultation:  Regular rate, without rubs, murmurs or gallops  Edema/varicosities:  Not grossly evident Abdominal  Soft,nontender, without masses, guarding or rebound.  Liver/spleen:  No organomegaly noted  Hernia:  None appreciated  Skin  Inspection:  Grossly normal   Breasts: Examined lying and sitting.     Right: Without masses, retractions, discharge or axillary adenopathy.     Left: Without masses, retractions, discharge or axillary adenopathy. Gentitourinary   Inguinal/mons:  Normal without inguinal adenopathy  External genitalia:  Normal  BUS/Urethra/Skene's glands:  Normal  Vagina:  Normal  Cervix:  Normal IUD strings visible  Uterus:   normal in size, shape and contour.  Midline and mobile  Adnexa/parametria:     Rt: Without masses or tenderness.   Lt: Without masses or tenderness.  Anus and perineum: Normal  Digital rectal exam: Normal sphincter tone without palpated masses or tenderness  Assessment/Plan:  46 y.o. M WF G1 P1 for annual  exam with no complaints.   11/2015 Mirena IUD-rare bleeding Depression stable on Wellbutrin  Plan: Wellbutrin XL 300 mg daily prescription, proper use given and reviewed instructed to follow-up with counseling as needed. Prescription for minocycline 50 mg daily has been on for years per dermatologist refill given. Congratulated on smoking cessation. SBE's, continue annual screening mammogram, calcium rich diet, vitamin D 2000 daily encouraged. Continue healthy lifestyle of regular exercise and healthy diet. CBC, glucose, UA, Pap with HR HPV typing, new Pap screening guidelines reviewed.    Huel Cote [facility], 9:56 AM 11/26/2016

## 2016-11-28 LAB — URINE CULTURE: Organism ID, Bacteria: NO GROWTH

## 2016-11-29 ENCOUNTER — Other Ambulatory Visit: Payer: Self-pay | Admitting: Women's Health

## 2016-11-29 DIAGNOSIS — R3129 Other microscopic hematuria: Secondary | ICD-10-CM

## 2016-11-29 LAB — PAP, TP IMAGING W/ HPV RNA, RFLX HPV TYPE 16,18/45: HPV mRNA, High Risk: NOT DETECTED

## 2016-11-30 ENCOUNTER — Other Ambulatory Visit: Payer: 59

## 2016-11-30 DIAGNOSIS — R3129 Other microscopic hematuria: Secondary | ICD-10-CM

## 2016-12-01 LAB — URINALYSIS W MICROSCOPIC + REFLEX CULTURE
BACTERIA UA: NONE SEEN [HPF]
Bilirubin Urine: NEGATIVE
CASTS: NONE SEEN [LPF]
Crystals: NONE SEEN [HPF]
Glucose, UA: NEGATIVE
KETONES UR: NEGATIVE
Leukocytes, UA: NEGATIVE
NITRITE: NEGATIVE
PH: 6 (ref 5.0–8.0)
PROTEIN: NEGATIVE
SQUAMOUS EPITHELIAL / LPF: NONE SEEN [HPF] (ref <=5)
Specific Gravity, Urine: 1.008 (ref 1.001–1.035)
WBC, UA: NONE SEEN WBC/HPF (ref <=5)
YEAST: NONE SEEN [HPF]

## 2016-12-02 LAB — URINE CULTURE: Organism ID, Bacteria: NO GROWTH

## 2017-01-14 ENCOUNTER — Other Ambulatory Visit: Payer: Self-pay | Admitting: Women's Health

## 2017-01-14 DIAGNOSIS — F4323 Adjustment disorder with mixed anxiety and depressed mood: Secondary | ICD-10-CM

## 2017-01-14 NOTE — Telephone Encounter (Signed)
#  90 with 4 refills was prescribe don 11/26/16. Rx sent.

## 2017-10-19 ENCOUNTER — Other Ambulatory Visit: Payer: Self-pay | Admitting: Women's Health

## 2017-10-19 DIAGNOSIS — Z1231 Encounter for screening mammogram for malignant neoplasm of breast: Secondary | ICD-10-CM

## 2017-11-11 ENCOUNTER — Ambulatory Visit
Admission: RE | Admit: 2017-11-11 | Discharge: 2017-11-11 | Disposition: A | Discharge status: Home / Self Care | Payer: 59 | Source: Ambulatory Visit | Attending: Women's Health | Admitting: Women's Health

## 2017-11-11 DIAGNOSIS — Z1231 Encounter for screening mammogram for malignant neoplasm of breast: Secondary | ICD-10-CM

## 2017-12-23 ENCOUNTER — Encounter: Payer: Self-pay | Admitting: Gynecology

## 2017-12-23 ENCOUNTER — Ambulatory Visit (INDEPENDENT_AMBULATORY_CARE_PROVIDER_SITE_OTHER): Payer: 59 | Admitting: Gynecology

## 2017-12-23 VITALS — BP 118/74 | Ht 63.0 in | Wt 139.0 lb

## 2017-12-23 DIAGNOSIS — F4323 Adjustment disorder with mixed anxiety and depressed mood: Secondary | ICD-10-CM

## 2017-12-23 DIAGNOSIS — F329 Major depressive disorder, single episode, unspecified: Secondary | ICD-10-CM

## 2017-12-23 DIAGNOSIS — Z01419 Encounter for gynecological examination (general) (routine) without abnormal findings: Secondary | ICD-10-CM

## 2017-12-23 DIAGNOSIS — Z30431 Encounter for routine checking of intrauterine contraceptive device: Secondary | ICD-10-CM | POA: Diagnosis not present

## 2017-12-23 DIAGNOSIS — F32A Depression, unspecified: Secondary | ICD-10-CM

## 2017-12-23 DIAGNOSIS — Z13220 Encounter for screening for lipoid disorders: Secondary | ICD-10-CM | POA: Diagnosis not present

## 2017-12-23 MED ORDER — BUPROPION HCL ER (XL) 300 MG PO TB24: 300.0000 mg | ORAL_TABLET | Freq: Every day | ORAL | 4 refills | Status: DC

## 2017-12-23 NOTE — Progress Notes (Signed)
    Katelyn Delgado Dec 14, 1970 [id number]        47 y.o.  G1P0101 for annual gynecologic exam.  Doing well without gynecologic complaints.  Past medical history,surgical history, problem list, medications, allergies, family history and social history were all reviewed and documented as reviewed in the EPIC chart.  ROS:  Performed with pertinent positives and negatives included in the history, assessment and plan.   Additional significant findings : None   Exam: Caryn Bee assistant Vitals:   12/23/17 0946  BP: 118/74  Weight: 139 lb (63 kg)  Height: 5\' 3"  (1.6 m)   Body mass index is 24.62 kg/m.  General appearance:  Normal affect, orientation and appearance. Skin: Grossly normal HEENT: Without gross lesions.  No cervical or supraclavicular adenopathy. Thyroid normal.  Lungs:  Clear without wheezing, rales or rhonchi Cardiac: RR, without RMG Abdominal:  Soft, nontender, without masses, guarding, rebound, organomegaly or hernia Breasts:  Examined lying and sitting without masses, retractions, discharge or axillary adenopathy. Pelvic:  Ext, BUS, Vagina: Normal  Cervix: Normal.  IUD string palpated  Uterus: Anteverted, normal size, shape and contour, midline and mobile nontender   Adnexa: Without masses or tenderness    Anus and perineum: Normal   Rectovaginal: Normal sphincter tone without palpated masses or tenderness.    Assessment/Plan:  47 y.o. G81P0101 female for annual gynecologic exam without menses, Mirena IUD.   1. Mirena IUD 11/2015.  Doing well without menses.  IUD string palpated. 2. Mammography 11/2017.  Continue with annual mammography next year.  Breast exam normal today. 3. Pap smear/HPV 2018.  No Pap smear done today.  No history of significant abnormal Pap smears. 4. History of depression on Wellbutrin XL.  Has done so for years without side effects.  Wishes to continue.  Refill times 1 year provided. 5. Health maintenance.  Future fasting orders placed for  CBC, CMP and lipid profile.  Patient will return fasting.  Check urine analysis today.  Follow-up in 1 year for annual exam, sooner as needed.   Anastasio Auerbach MD, 10:15 AM 12/23/2017

## 2017-12-23 NOTE — Patient Instructions (Signed)
Follow-up for fasting lab work.  Follow-up in 1 year for annual exam.

## 2017-12-26 LAB — URINALYSIS, COMPLETE W/RFL CULTURE
BACTERIA UA: NONE SEEN /HPF
Bilirubin Urine: NEGATIVE
Glucose, UA: NEGATIVE
HGB URINE DIPSTICK: NEGATIVE
HYALINE CAST: NONE SEEN /LPF
KETONES UR: NEGATIVE
LEUKOCYTE ESTERASE: NEGATIVE
Nitrites, Initial: NEGATIVE
PH: 6 (ref 5.0–8.0)
PROTEIN: NEGATIVE
Specific Gravity, Urine: 1.018 (ref 1.001–1.03)
WBC UA: NONE SEEN /HPF (ref 0–5)

## 2017-12-26 LAB — URINE CULTURE
MICRO NUMBER:: 90362985
Result:: NO GROWTH
SPECIMEN QUALITY:: ADEQUATE

## 2017-12-26 LAB — CULTURE INDICATED

## 2017-12-27 ENCOUNTER — Other Ambulatory Visit: Payer: 59

## 2019-02-19 ENCOUNTER — Other Ambulatory Visit: Payer: Self-pay | Admitting: Gynecology

## 2019-02-19 DIAGNOSIS — F4323 Adjustment disorder with mixed anxiety and depressed mood: Secondary | ICD-10-CM

## 2019-02-21 ENCOUNTER — Other Ambulatory Visit: Payer: Self-pay | Admitting: Women's Health

## 2019-02-21 DIAGNOSIS — Z1231 Encounter for screening mammogram for malignant neoplasm of breast: Secondary | ICD-10-CM

## 2019-03-09 ENCOUNTER — Ambulatory Visit (INDEPENDENT_AMBULATORY_CARE_PROVIDER_SITE_OTHER): Payer: 59 | Admitting: Gynecology

## 2019-03-09 ENCOUNTER — Other Ambulatory Visit: Payer: Self-pay

## 2019-03-09 ENCOUNTER — Encounter: Payer: Self-pay | Admitting: Gynecology

## 2019-03-09 VITALS — BP 118/70 | Ht 62.0 in | Wt 132.0 lb

## 2019-03-09 DIAGNOSIS — Z01419 Encounter for gynecological examination (general) (routine) without abnormal findings: Secondary | ICD-10-CM

## 2019-03-09 DIAGNOSIS — Z30431 Encounter for routine checking of intrauterine contraceptive device: Secondary | ICD-10-CM | POA: Diagnosis not present

## 2019-03-09 DIAGNOSIS — Z13220 Encounter for screening for lipoid disorders: Secondary | ICD-10-CM

## 2019-03-09 DIAGNOSIS — F4323 Adjustment disorder with mixed anxiety and depressed mood: Secondary | ICD-10-CM

## 2019-03-09 MED ORDER — BUPROPION HCL ER (XL) 300 MG PO TB24: 300.0000 mg | ORAL_TABLET | Freq: Every day | ORAL | 4 refills | Status: DC

## 2019-03-09 NOTE — Progress Notes (Signed)
    Katelyn Delgado 1971/06/29 [id number]        48 y.o.  G1P0101 for annual gynecologic exam.  Without gynecologic complaints  Past medical history,surgical history, problem list, medications, allergies, family history and social history were all reviewed and documented as reviewed in the EPIC chart.  ROS:  Performed with pertinent positives and negatives included in the history, assessment and plan.   Additional significant findings : None   Exam: Caryn Bee assistant Vitals:   03/09/19 1423  BP: 118/70  Weight: 132 lb (59.9 kg)  Height: 5\' 2"  (1.575 m)   Body mass index is 24.14 kg/m.  General appearance:  Normal affect, orientation and appearance. Skin: Grossly normal HEENT: Without gross lesions.  No cervical or supraclavicular adenopathy. Thyroid normal.  Lungs:  Clear without wheezing, rales or rhonchi Cardiac: RR, without RMG Abdominal:  Soft, nontender, without masses, guarding, rebound, organomegaly or hernia Breasts:  Examined lying and sitting without masses, retractions, discharge or axillary adenopathy. Pelvic:  Ext, BUS, Vagina: Normal  Cervix: Normal.  IUD string palpated  Uterus: Anteverted, normal size, shape and contour, midline and mobile nontender   Adnexa: Without masses or tenderness    Anus and perineum: Normal   Rectovaginal: Normal sphincter tone without palpated masses or tenderness.    Assessment/Plan:  48 y.o. G4P0101 female for annual gynecologic exam.  Without menses, Mirena IUD  1. Mirena IUD 11/2015.  Doing well without menses.  String palpated. 2. Mammography due now and patient will call and schedule.  Breast exam normal today. 3. Pap smear/HPV 11/2016.  No Pap smear done today.  No history of abnormal Pap smears previously. 4. History of depression.  On Wellbutrin XL for years.  Has done well and wants to continue without side effects.  Refill x1 year provided. 5. Health maintenance.  Was to follow-up for lab work last year fasting but  never did.  Future orders placed today for fasting CBC, CMP and lipid profile.  Patient agrees to follow-up fasting.  Follow-up in 1 year for annual exam,   Anastasio Auerbach MD, 2:52 PM 03/09/2019

## 2019-03-09 NOTE — Patient Instructions (Signed)
Follow-up for fasting blood work as arranged.  Follow-up in 1 year for annual exam

## 2019-04-13 ENCOUNTER — Ambulatory Visit: Payer: 59

## 2019-05-25 ENCOUNTER — Other Ambulatory Visit: Payer: Self-pay

## 2019-05-25 ENCOUNTER — Ambulatory Visit
Admission: RE | Admit: 2019-05-25 | Discharge: 2019-05-25 | Disposition: A | Discharge status: Home / Self Care | Payer: 59 | Source: Ambulatory Visit | Attending: Women's Health | Admitting: Women's Health

## 2019-05-25 DIAGNOSIS — Z1231 Encounter for screening mammogram for malignant neoplasm of breast: Secondary | ICD-10-CM

## 2019-06-26 ENCOUNTER — Encounter: Payer: Self-pay | Admitting: Gynecology

## 2020-04-09 ENCOUNTER — Other Ambulatory Visit: Payer: Self-pay

## 2020-04-09 ENCOUNTER — Ambulatory Visit (INDEPENDENT_AMBULATORY_CARE_PROVIDER_SITE_OTHER): Payer: 59 | Admitting: Nurse Practitioner

## 2020-04-09 ENCOUNTER — Encounter: Payer: Self-pay | Admitting: Nurse Practitioner

## 2020-04-09 VITALS — BP 128/80 | Ht 62.0 in | Wt 130.0 lb

## 2020-04-09 DIAGNOSIS — Z13220 Encounter for screening for lipoid disorders: Secondary | ICD-10-CM | POA: Diagnosis not present

## 2020-04-09 DIAGNOSIS — Z01419 Encounter for gynecological examination (general) (routine) without abnormal findings: Secondary | ICD-10-CM | POA: Diagnosis not present

## 2020-04-09 DIAGNOSIS — R232 Flushing: Secondary | ICD-10-CM

## 2020-04-09 LAB — COMPREHENSIVE METABOLIC PANEL
AG Ratio: 1.9 (calc) (ref 1.0–2.5)
ALT: 17 U/L (ref 6–29)
AST: 19 U/L (ref 10–35)
Albumin: 4.6 g/dL (ref 3.6–5.1)
Alkaline phosphatase (APISO): 49 U/L (ref 31–125)
BUN: 12 mg/dL (ref 7–25)
CO2: 28 mmol/L (ref 20–32)
Calcium: 9.8 mg/dL (ref 8.6–10.2)
Chloride: 102 mmol/L (ref 98–110)
Creat: 0.86 mg/dL (ref 0.50–1.10)
Globulin: 2.4 g/dL (calc) (ref 1.9–3.7)
Glucose, Bld: 93 mg/dL (ref 65–99)
Potassium: 4.3 mmol/L (ref 3.5–5.3)
Sodium: 138 mmol/L (ref 135–146)
Total Bilirubin: 0.8 mg/dL (ref 0.2–1.2)
Total Protein: 7 g/dL (ref 6.1–8.1)

## 2020-04-09 LAB — CBC WITH DIFFERENTIAL/PLATELET
Absolute Monocytes: 551 cells/uL (ref 200–950)
Basophils Absolute: 41 cells/uL (ref 0–200)
Basophils Relative: 0.6 %
Eosinophils Absolute: 129 cells/uL (ref 15–500)
Eosinophils Relative: 1.9 %
HCT: 44.4 % (ref 35.0–45.0)
Hemoglobin: 14.8 g/dL (ref 11.7–15.5)
Lymphs Abs: 1652 cells/uL (ref 850–3900)
MCH: 33.5 pg — ABNORMAL HIGH (ref 27.0–33.0)
MCHC: 33.3 g/dL (ref 32.0–36.0)
MCV: 100.5 fL — ABNORMAL HIGH (ref 80.0–100.0)
MPV: 9.2 fL (ref 7.5–12.5)
Monocytes Relative: 8.1 %
Neutro Abs: 4427 cells/uL (ref 1500–7800)
Neutrophils Relative %: 65.1 %
Platelets: 299 10*3/uL (ref 140–400)
RBC: 4.42 10*6/uL (ref 3.80–5.10)
RDW: 11.8 % (ref 11.0–15.0)
Total Lymphocyte: 24.3 %
WBC: 6.8 10*3/uL (ref 3.8–10.8)

## 2020-04-09 LAB — LIPID PANEL
Cholesterol: 208 mg/dL — ABNORMAL HIGH (ref <200)
HDL: 87 mg/dL (ref >=50)
LDL Cholesterol (Calc): 104 mg/dL (calc) — ABNORMAL HIGH
Non-HDL Cholesterol (Calc): 121 mg/dL (calc) (ref <130)
Total CHOL/HDL Ratio: 2.4 (calc) (ref <5.0)
Triglycerides: 78 mg/dL (ref <150)

## 2020-04-09 LAB — FOLLICLE STIMULATING HORMONE: FSH: 39 m[IU]/mL

## 2020-04-09 NOTE — Progress Notes (Signed)
   Katelyn Delgado 50-Apr-1972 [id number]   History:  49 y.o. G1P0101 presents for annual exam without GYN complaints. Amenorrheic/Mirena IUD inserted 11/2015. Has been having some hot flashes and irritability and would like to check Katelyn Delgado to see if she is menopausal. Normal pap and mammogram history. Plans to find a PCP soon.   Gynecologic History No LMP recorded (lmp unknown). (Menstrual status: IUD).   Contraception: IUD Last Pap: 11/26/2016. Results were: normal Last mammogram: 05/29/2019. Results were: normal  Past medical history, past surgical history, family history and social history were all reviewed and documented in the EPIC chart.  ROS:  A ROS was performed and pertinent positives and negatives are included.  Exam:  Vitals:   04/09/20 0840  Weight: 130 lb (59 kg)  Height: 5\' 2"  (1.575 m)   Body mass index is 23.78 kg/m.  General appearance:  Normal Thyroid:  Symmetrical, normal in size, without palpable masses or nodularity. Respiratory  Auscultation:  Clear without wheezing or rhonchi Cardiovascular  Auscultation:  Regular rate, without rubs, murmurs or gallops  Edema/varicosities:  Not grossly evident Abdominal  Soft,nontender, without masses, guarding or rebound.  Liver/spleen:  No organomegaly noted  Hernia:  None appreciated  Skin  Inspection:  Grossly normal   Breasts: Examined lying and sitting.   Right: Without masses, retractions, discharge or axillary adenopathy.   Left: Without masses, retractions, discharge or axillary adenopathy. Gentitourinary   Inguinal/mons:  Normal without inguinal adenopathy  External genitalia:  Normal  BUS/Urethra/Skene's glands:  Normal  Vagina:  Normal  Cervix:  Normal, IUD string visible  Uterus:  Anteverted, normal in size, shape and contour.  Midline and mobile  Adnexa/parametria:     Rt: Without masses or tenderness.   Lt: Without masses or tenderness.  Anus and perineum: Normal   Assessment/Plan:  49 y.o.  G1P0101 for annual exam.    Well female exam with routine gynecological exam - Plan: CBC with Differential/Platelet, Comprehensive metabolic panel. Education provided on SBEs, importance of preventative screenings, current guidelines, high calcium diet, regular exercise, and multivitamin daily.   Lipid screening - Plan: Lipid panel  Hot flashes - Plan: [name]  Follow up in 1 year for annual    Katelyn Delgado, 8:42 AM 04/09/2020

## 2020-04-09 NOTE — Patient Instructions (Signed)
Health Maintenance, Female Adopting a healthy lifestyle and getting preventive care are important in promoting health and wellness. Ask your health care provider about:  The right schedule for you to have regular tests and exams.  Things you can do on your own to prevent diseases and keep yourself healthy. What should I know about diet, weight, and exercise? Eat a healthy diet   Eat a diet that includes plenty of vegetables, fruits, low-fat dairy products, and lean protein.  Do not eat a lot of foods that are high in solid fats, added sugars, or sodium. Maintain a healthy weight Body mass index (BMI) is used to identify weight problems. It estimates body fat based on height and weight. Your health care provider can help determine your BMI and help you achieve or maintain a healthy weight. Get regular exercise Get regular exercise. This is one of the most important things you can do for your health. Most adults should:  Exercise for at least 150 minutes each week. The exercise should increase your heart rate and make you sweat (moderate-intensity exercise).  Do strengthening exercises at least twice a week. This is in addition to the moderate-intensity exercise.  Spend less time sitting. Even light physical activity can be beneficial. Watch cholesterol and blood lipids Have your blood tested for lipids and cholesterol at 50 years of age, then have this test every 5 years. Have your cholesterol levels checked more often if:  Your lipid or cholesterol levels are high.  You are older than 50 years of age.  You are at high risk for heart disease. What should I know about cancer screening? Depending on your health history and family history, you may need to have cancer screening at various ages. This may include screening for:  Breast cancer.  Cervical cancer.  Colorectal cancer.  Skin cancer.  Lung cancer. What should I know about heart disease, diabetes, and high blood  pressure? Blood pressure and heart disease  High blood pressure causes heart disease and increases the risk of stroke. This is more likely to develop in people who have high blood pressure readings, are of African descent, or are overweight.  Have your blood pressure checked: ? Every 3-5 years if you are 18-39 years of age. ? Every year if you are 40 years old or older. Diabetes Have regular diabetes screenings. This checks your fasting blood sugar level. Have the screening done:  Once every three years after age 40 if you are at a normal weight and have a low risk for diabetes.  More often and at a younger age if you are overweight or have a high risk for diabetes. What should I know about preventing infection? Hepatitis B If you have a higher risk for hepatitis B, you should be screened for this virus. Talk with your health care provider to find out if you are at risk for hepatitis B infection. Hepatitis C Testing is recommended for:  Everyone born from 1945 through 1965.  Anyone with known risk factors for hepatitis C. Sexually transmitted infections (STIs)  Get screened for STIs, including gonorrhea and chlamydia, if: ? You are sexually active and are younger than 50 years of age. ? You are older than 50 years of age and your health care provider tells you that you are at risk for this type of infection. ? Your sexual activity has changed since you were last screened, and you are at increased risk for chlamydia or gonorrhea. Ask your health care provider if   you are at risk.  Ask your health care provider about whether you are at high risk for HIV. Your health care provider may recommend a prescription medicine to help prevent HIV infection. If you choose to take medicine to prevent HIV, you should first get tested for HIV. You should then be tested every 3 months for as long as you are taking the medicine. Pregnancy  If you are about to stop having your period (premenopausal) and  you may become pregnant, seek counseling before you get pregnant.  Take 400 to 800 micrograms (mcg) of folic acid every day if you become pregnant.  Ask for birth control (contraception) if you want to prevent pregnancy. Osteoporosis and menopause Osteoporosis is a disease in which the bones lose minerals and strength with aging. This can result in bone fractures. If you are 65 years old or older, or if you are at risk for osteoporosis and fractures, ask your health care provider if you should:  Be screened for bone loss.  Take a calcium or vitamin D supplement to lower your risk of fractures.  Be given hormone replacement therapy (HRT) to treat symptoms of menopause. Follow these instructions at home: Lifestyle  Do not use any products that contain nicotine or tobacco, such as cigarettes, e-cigarettes, and chewing tobacco. If you need help quitting, ask your health care provider.  Do not use street drugs.  Do not share needles.  Ask your health care provider for help if you need support or information about quitting drugs. Alcohol use  Do not drink alcohol if: ? Your health care provider tells you not to drink. ? You are pregnant, may be pregnant, or are planning to become pregnant.  If you drink alcohol: ? Limit how much you use to 0-1 drink a day. ? Limit intake if you are breastfeeding.  Be aware of how much alcohol is in your drink. In the [place] one drink equals one 12 oz bottle of beer (355 mL), one 5 oz glass of wine (148 mL), or one 1 oz glass of hard liquor (44 mL). General instructions  Schedule regular health, dental, and eye exams.  Stay current with your vaccines.  Tell your health care provider if: ? You often feel depressed. ? You have ever been abused or do not feel safe at home. Summary  Adopting a healthy lifestyle and getting preventive care are important in promoting health and wellness.  Follow your health care provider's instructions about healthy  diet, exercising, and getting tested or screened for diseases.  Follow your health care provider's instructions on monitoring your cholesterol and blood pressure. This information is not intended to replace advice given to you by your health care provider. Make sure you discuss any questions you have with your health care provider. Document Revised: 09/13/2018 Document Reviewed: 09/13/2018 [employer] Patient Education  2020 [employer]  

## 2020-06-02 ENCOUNTER — Other Ambulatory Visit: Payer: Self-pay

## 2020-06-02 DIAGNOSIS — F4323 Adjustment disorder with mixed anxiety and depressed mood: Secondary | ICD-10-CM

## 2020-06-03 MED ORDER — BUPROPION HCL ER (XL) 300 MG PO TB24: 300.0000 mg | ORAL_TABLET | Freq: Every day | ORAL | 3 refills | Status: DC

## 2020-07-09 ENCOUNTER — Other Ambulatory Visit: Payer: Self-pay | Admitting: Nurse Practitioner

## 2020-07-09 DIAGNOSIS — Z1231 Encounter for screening mammogram for malignant neoplasm of breast: Secondary | ICD-10-CM

## 2020-08-01 ENCOUNTER — Ambulatory Visit
Admission: RE | Admit: 2020-08-01 | Discharge: 2020-08-01 | Disposition: A | Discharge status: Home / Self Care | Payer: 59 | Source: Ambulatory Visit | Attending: Nurse Practitioner | Admitting: Nurse Practitioner

## 2020-08-01 ENCOUNTER — Other Ambulatory Visit: Payer: Self-pay

## 2020-08-01 DIAGNOSIS — Z1231 Encounter for screening mammogram for malignant neoplasm of breast: Secondary | ICD-10-CM

## 2020-11-07 ENCOUNTER — Other Ambulatory Visit: Payer: Self-pay | Admitting: *Deleted

## 2020-11-07 DIAGNOSIS — Z30432 Encounter for removal of intrauterine contraceptive device: Secondary | ICD-10-CM

## 2020-11-07 DIAGNOSIS — Z30430 Encounter for insertion of intrauterine contraceptive device: Secondary | ICD-10-CM

## 2020-11-11 ENCOUNTER — Telehealth: Payer: Self-pay | Admitting: Obstetrics and Gynecology

## 2020-11-11 ENCOUNTER — Other Ambulatory Visit: Payer: Self-pay

## 2020-11-11 ENCOUNTER — Encounter: Payer: Self-pay | Admitting: Obstetrics and Gynecology

## 2020-11-11 ENCOUNTER — Ambulatory Visit (INDEPENDENT_AMBULATORY_CARE_PROVIDER_SITE_OTHER): Payer: 59 | Admitting: Obstetrics and Gynecology

## 2020-11-11 DIAGNOSIS — Z3009 Encounter for other general counseling and advice on contraception: Secondary | ICD-10-CM | POA: Diagnosis not present

## 2020-11-11 DIAGNOSIS — N951 Menopausal and female climacteric states: Secondary | ICD-10-CM

## 2020-11-11 DIAGNOSIS — L74510 Primary focal hyperhidrosis, axilla: Secondary | ICD-10-CM

## 2020-11-11 MED ORDER — DRYSOL 20 % EX SOLN: Freq: Every day | CUTANEOUS | 1 refills | Status: DC

## 2020-11-11 NOTE — Patient Instructions (Signed)
Hyperhidrosis Hyperhidrosis is a condition in which the body sweats a lot more than normal (excessively). Sweating is a necessary function for a human body. It is normal to sweat when you are hot, physically active, or anxious. However, hyperhidrosis is sweating to an excessive degree. Although the condition is not a serious one, it can make you feel embarrassed. There are two kinds of hyperhidrosis:  Primary hyperhidrosis. The sweating usually localizes in one part of your body, such as your underarms, or in a few areas, such as your feet, face, underarms, and hands. This is the more common kind of hyperhidrosis.  Secondary hyperhidrosis. This type usually affects your entire body. What are the causes? The cause of this condition depends on the kind of hyperhidrosis that you have.  Primary hyperhidrosis may be caused by sweat glands that are more active than normal.  Secondary hyperhidrosis may be caused by an underlying condition or by taking certain medicines, such as antidepressants or diabetes medicines. Possible conditions that may cause secondary hyperhidrosis include: ? Diabetes. ? Gout. ? Anxiety. ? Obesity. ? Menopause. ? Overactive thyroid (hyperthyroidism). ? Tumors. ? Frostbite. ? Certain types of cancers. ? Alcoholism. ? Injury to your nervous system. ? Stroke. ? Parkinson's disease. What increases the risk? You are more likely to develop primary hyperhidrosis if you have a family history of the condition. What are the signs or symptoms? Symptoms of this condition include:  Feeling like you are sweating constantly, even while you are not being active.  Having skin that peels or gets paler or softer in the areas where you sweat the most.  Being able to see sweat on your skin. Other symptoms depend on the kind of hyperhidrosis that you have.  Symptoms of primary hyperhidrosis may include: ? Sweating in the same location on both sides of your body. ? Sweating only  during the day and not while you are sleeping. ? Sweating in specific areas, such as your underarms, palms, feet, and face.  Symptoms of secondary hyperhidrosis may include: ? Sweating all over your body. ? Sweating even while you sleep. How is this diagnosed? This condition may be diagnosed by:  Medical history.  Physical exam. You may also have other tests, including:  Tests to measure the amount of sweat you produce and to show the areas where you sweat the most. These tests may involve: ? Using color-changing chemicals to show patterns of sweating on the skin. ? Weighing paper that has been applied to the skin. This will show the amount of sweat that your body produces. ? Measuring the amount of water that evaporates from the skin. ? Using infrared technology to show patterns of sweating on the skin.  Tests to check for other conditions that may be causing excess sweating. This may include blood, urine, or imaging tests. How is this treated? Treatment for this condition depends on the kind of hyperhidrosis that you have and the areas of your body that are affected. Your health care provider will also treat any underlying conditions. Treatment may include:  Medicines, such as: ? Antiperspirants. These are medicines that stop sweat. ? Injectable medicines. These may include small injections of botulinum toxin. ? Oral medicines. These are taken by mouth to treat underlying conditions and other symptoms.  A procedure to: ? Temporarily turn off the sweat glands in your hands and feet (iontophoresis). ? Remove your sweat glands. ? Cut or destroy the nerves so that they do not send a signal to the sweat  glands (sympathectomy). Follow these instructions at home: Lifestyle  Limit or avoid foods or beverages that may increase your risk of sweating, such as: ? Spicy food. ? Caffeine. ? Alcohol. ? Foods that contain monosodium glutamate (MSG).  If your feet sweat: ? Wear sandals  when possible. ? Do not wear cotton socks. Wear socks that remove or wick moisture from your feet. ? Wear leather shoes. ? Avoid wearing the same pair of shoes for two days in a row.  Try placing sweat pads under your clothes to prevent underarm sweat from showing.  Keep a journal of your sweat symptoms and when they occur. This may help you identify things that trigger your sweating.   General instructions  Take over-the-counter and prescription medicines only as told by your health care provider.  Use antiperspirants as told by your health care provider.  Consider joining a hyperhidrosis support group.  Keep all follow-up visits as told by your health care provider. This is important. Contact a health care provider if:  You have new symptoms.  Your symptoms get worse. Summary  Hyperhidrosis is a condition in which the body sweats a lot more than normal (excessively).  With primary hyperhidrosis, the sweating usually localizes in one part of your body, such as your underarms, or in a few areas, such as your feet, face, underarms, and hands. It is caused by overactive sweat glands in the affected area.  With secondary hyperhidrosis, the sweating affects your entire body. This is caused by an underlying condition.  Treatment for this condition depends on the kind of hyperhidrosis that you have and the parts of your body that are affected. This information is not intended to replace advice given to you by your health care provider. Make sure you discuss any questions you have with your health care provider. Document Revised: 07/16/2020 Document Reviewed: 07/16/2020 Elsevier Patient Education  2021 [place] is the normal time of a woman's life when the levels of estrogen, the female hormone produced by the ovaries, begin to decrease. This leads to changes in menstrual periods before they stop completely (menopause). Perimenopause can begin 2-8 years  before menopause. During perimenopause, the ovaries may or may not produce an egg and a woman can still become pregnant. What are the causes? This condition is caused by a natural change in hormone levels that happens as you get older. What increases the risk? This condition is more likely to start at an earlier age if you have certain medical conditions or have undergone treatments, including:  A tumor of the pituitary gland in the brain.  A disease that affects the ovaries and hormone production.  Certain cancer treatments, such as chemotherapy or hormone therapy, or radiation therapy on the pelvis.  Heavy smoking and excessive alcohol use.  Family history of early menopause. What are the signs or symptoms? Perimenopausal changes affect each woman differently. Symptoms of this condition may include:  Hot flashes.  Irregular menstrual periods.  Night sweats.  Changes in feelings about sex. This could be a decrease in sex drive or an increased discomfort around your sexuality.  Vaginal dryness.  Headaches.  Mood swings.  Depression.  Problems sleeping (insomnia).  Memory problems or trouble concentrating.  Irritability.  Tiredness.  Weight gain.  Anxiety.  Trouble getting pregnant. How is this diagnosed? This condition is diagnosed based on your medical history, a physical exam, your age, your menstrual history, and your symptoms. Hormone tests may also be done. How is  this treated? In some cases, no treatment is needed. You and your health care provider should make a decision together about whether treatment is necessary. Treatment will be based on your individual condition and preferences. Various treatments are available, such as:  Menopausal hormone therapy (MHT).  Medicines to treat specific symptoms.  Acupuncture.  Vitamin or herbal supplements. Before starting treatment, make sure to let your health care provider know if you have a personal or family  history of:  Heart disease.  Breast cancer.  Blood clots.  Diabetes.  Osteoporosis. Follow these instructions at home: Medicines  Take over-the-counter and prescription medicines only as told by your health care provider.  Take vitamin supplements only as told by your health care provider.  Talk with your health care provider before starting any herbal supplements. Lifestyle  Do not use any products that contain nicotine or tobacco, such as cigarettes, e-cigarettes, and chewing tobacco. If you need help quitting, ask your health care provider.  Get at least 30 minutes of physical activity on 5 or more days each week.  Eat a balanced diet that includes fresh fruits and vegetables, whole grains, soybeans, eggs, lean meat, and low-fat dairy.  Avoid alcoholic and caffeinated beverages, as well as spicy foods. This may help prevent hot flashes.  Get 7-8 hours of sleep each night.  Dress in layers that can be removed to help you manage hot flashes.  Find ways to manage stress, such as deep breathing, meditation, or journaling.   General instructions  Keep track of your menstrual periods, including: ? When they occur. ? How heavy they are and how long they last. ? How much time passes between periods.  Keep track of your symptoms, noting when they start, how often you have them, and how long they last.  Use vaginal lubricants or moisturizers to help with vaginal dryness and improve comfort during sex.  You can still become pregnant if you are having irregular periods. Make sure you use contraception during perimenopause if you do not want to get pregnant.  Keep all follow-up visits. This is important. This includes any group therapy or counseling.   Contact a health care provider if:  You have heavy vaginal bleeding or pass blood clots.  Your period lasts more than 2 days longer than normal.  Your periods are recurring sooner than 21 days.  You bleed after having  sex.  You have pain during sex. Get help right away if you have:  Chest pain, trouble breathing, or trouble talking.  Severe depression.  Pain when you urinate.  Severe headaches.  Vision problems. Summary  Perimenopause is the time when a woman's body begins to move into menopause. This may happen naturally or as a result of other health problems or medical treatments.  Perimenopause can begin 2-8 years before menopause, and it can last for several years.  Perimenopausal symptoms can be managed through medicines, lifestyle changes, and complementary therapies such as acupuncture. This information is not intended to replace advice given to you by your health care provider. Make sure you discuss any questions you have with your health care provider. Document Revised: 03/06/2020 Document Reviewed: 03/06/2020 Elsevier Patient Education  [place].

## 2020-11-11 NOTE — Telephone Encounter (Signed)
Information about drysol sent

## 2020-11-11 NOTE — Progress Notes (Signed)
GYNECOLOGY  VISIT   HPI: 50 y.o.   Married White or Caucasian Not Hispanic or Latino  female   [phone] with No LMP recorded. (Menstrual status: IUD).   here for Mirena removal and reinsertion.  IUD was inserted in 2/17. She has had occasional vasomotor symptoms, not bothersome. She had an [facility] in 7/21 that was 39.  She c/o excessive sweating in the axilla, bothersome and embarrassing.   GYNECOLOGIC HISTORY: No LMP recorded. (Menstrual status: IUD). Contraception:Mirena IUD  Menopausal hormone therapy: none        OB History    Gravida  1   Para  1   Term      Preterm  1   AB      Living  1     SAB      IAB      Ectopic      Multiple      Live Births                 Patient Active Problem List   Diagnosis Date Noted  . Depression (emotion) 11/08/2013    Past Medical History:  Diagnosis Date  . Acne   . History of hematuria   . MVP (mitral valve prolapse)   . Spontaneous vaginal delivery    premie 28 weeks    Past Surgical History:  Procedure Laterality Date  . INTRAUTERINE DEVICE INSERTION  11/28/2015   Mirena    Current Outpatient Medications  Medication Sig Dispense Refill  . buPROPion (WELLBUTRIN XL) 300 MG 24 hr tablet Take 1 tablet (300 mg total) by mouth daily. 90 tablet 3  . Cholecalciferol (VITAMIN D PO) Take by mouth.    . doxycycline (ORACEA) 40 MG capsule Take 40 mg by mouth every morning.     No current facility-administered medications for this visit.     ALLERGIES: Sulfa antibiotics  Family History  Problem Relation Age of Onset  . Hypertension Mother   . Breast cancer Maternal Aunt        early 68's    Social History   Socioeconomic History  . Marital status: Married    Spouse name: Not on file  . Number of children: Not on file  . Years of education: Not on file  . Highest education level: Not on file  Occupational History  . Not on file  Tobacco Use  . Smoking status: Former Smoker    Quit date: 10/04/2016     Years since quitting: 4.1  . Smokeless tobacco: Never Used  Vaping Use  . Vaping Use: Never used  Substance and Sexual Activity  . Alcohol use: Yes    Comment: sometimes  . Drug use: No  . Sexual activity: Yes    Birth control/protection: I.U.D.    Comment: Mirena 11/28/2015  1st intercourse 16, NO MORE THAN 5 PARTNERS  Other Topics Concern  . Not on file  Social History Narrative  . Not on file   Social Determinants of Health   Financial Resource Strain: Not on file  Food Insecurity: Not on file  Transportation Needs: Not on file  Physical Activity: Not on file  Stress: Not on file  Social Connections: Not on file  Intimate Partner Violence: Not on file    ROS  PHYSICAL EXAMINATION:    There were no vitals taken for this visit.    General appearance: alert, cooperative and appears stated age  72. Encounter for counseling regarding contraception She doesn't need to have  her IUD removed. Discussed new guidelines for 7 years.   2. Perimenopause Mild symptoms, information put in mychart.    3. Hyperhidrosis of axilla Very bothersome. - aluminum chloride (DRYSOL) 20 % external solution; Apply topically at bedtime. Once excessive sweating has stopped, can apply 1-2 x a week.  Dispense: 35 mL; Refill: 1

## 2020-11-26 NOTE — Telephone Encounter (Signed)
I called patient and read her the unread My Chart message.

## 2021-06-04 ENCOUNTER — Other Ambulatory Visit: Payer: Self-pay | Admitting: Nurse Practitioner

## 2021-06-04 DIAGNOSIS — F4323 Adjustment disorder with mixed anxiety and depressed mood: Secondary | ICD-10-CM

## 2021-06-04 NOTE — Telephone Encounter (Signed)
Annual exam scheduled on 06/23/21

## 2021-06-23 ENCOUNTER — Other Ambulatory Visit: Payer: Self-pay

## 2021-06-23 ENCOUNTER — Ambulatory Visit (INDEPENDENT_AMBULATORY_CARE_PROVIDER_SITE_OTHER): Payer: 59 | Admitting: Nurse Practitioner

## 2021-06-23 ENCOUNTER — Other Ambulatory Visit (HOSPITAL_COMMUNITY)
Admission: RE | Admit: 2021-06-23 | Discharge: 2021-06-23 | Disposition: A | Discharge status: Home / Self Care | Payer: 59 | Source: Ambulatory Visit | Attending: Nurse Practitioner | Admitting: Nurse Practitioner

## 2021-06-23 ENCOUNTER — Encounter: Payer: Self-pay | Admitting: Nurse Practitioner

## 2021-06-23 VITALS — BP 120/72 | Ht 62.5 in | Wt 136.0 lb

## 2021-06-23 DIAGNOSIS — B373 Candidiasis of vulva and vagina: Secondary | ICD-10-CM

## 2021-06-23 DIAGNOSIS — B3731 Acute candidiasis of vulva and vagina: Secondary | ICD-10-CM

## 2021-06-23 DIAGNOSIS — Z01419 Encounter for gynecological examination (general) (routine) without abnormal findings: Secondary | ICD-10-CM | POA: Insufficient documentation

## 2021-06-23 DIAGNOSIS — L700 Acne vulgaris: Secondary | ICD-10-CM

## 2021-06-23 DIAGNOSIS — Z30431 Encounter for routine checking of intrauterine contraceptive device: Secondary | ICD-10-CM | POA: Diagnosis not present

## 2021-06-23 MED ORDER — FLUCONAZOLE 150 MG PO TABS: 150.0000 mg | ORAL_TABLET | ORAL | 0 refills | Status: DC

## 2021-06-23 MED ORDER — DOXYCYCLINE 40 MG PO CPDR: 40.0000 mg | DELAYED_RELEASE_CAPSULE | ORAL | 3 refills | Status: DC

## 2021-06-23 NOTE — Patient Instructions (Addendum)
Katelyn Delgado, [place], OR ginseng   [facility] [phone] [address] [place], [place] [address]

## 2021-06-23 NOTE — Progress Notes (Addendum)
Katelyn Delgado [birth year] [id number]   History:  50 y.o. G1P0101 presents for annual exam. Amenorrheic/Mirena IUD inserted 11/2015. Having hot flashes and night sweats. [place] 39 one year ago. Normal pap and mammogram history. Treated yeast infection with OTC monistat 1 a couple of days ago but feels symptoms are still present. Takes Doxycycline for acne, has for years and dermatology told her she can take indefinitely.   Gynecologic History No LMP recorded. (Menstrual status: IUD).   Contraception: IUD Sexually active: Yes  Health Maintenance Last Pap: 11/26/2016. Results were: Normal, 5-year repeat Last mammogram: 08/01/2020. Results were: Normal Last colonoscopy: Never Last Dexa: Not indicated  Past medical history, past surgical history, family history and social history were all reviewed and documented in the EPIC chart. Married. Works at [employer]. 33 year old daughter.   ROS:  A ROS was performed and pertinent positives and negatives are included.  Exam:  Vitals:   06/23/21 1445  BP: 120/72  Weight: 136 lb (61.7 kg)  Height: 5' 2.5" (1.588 m)    Body mass index is 24.48 kg/m.  General appearance:  Normal Thyroid:  Symmetrical, normal in size, without palpable masses or nodularity. Respiratory  Auscultation:  Clear without wheezing or rhonchi Cardiovascular  Auscultation:  Regular rate, without rubs, murmurs or gallops  Edema/varicosities:  Not grossly evident Abdominal  Soft,nontender, without masses, guarding or rebound.  Liver/spleen:  No organomegaly noted  Hernia:  None appreciated  Skin  Inspection:  Grossly normal   Breasts: Examined lying and sitting.   Right: Without masses, retractions, discharge or axillary adenopathy.   Left: Without masses, retractions, discharge or axillary adenopathy. Gentitourinary   Inguinal/mons:  Normal without inguinal adenopathy  External genitalia:  Normal  BUS/Urethra/Skene's glands:  Normal  Vagina:  Mild erythema,  white, chunky discharge   Cervix:  Normal, IUD string visible  Uterus:  Normal in size, shape and contour.  Midline and mobile, nontender  Adnexa/parametria:     Rt: Normal in size, without masses or tenderness.   Lt: Normal in size, without masses or tenderness.  Anus and perineum: Normal  Digital rectal exam: Normal sphincter tone without palpated masses or tenderness  Patient informed chaperone available to be present for breast and pelvic exam. Patient has requested no chaperone to be present. Patient has been advised what will be completed during breast and pelvic exam.   Assessment/Plan:  50 y.o. G1P0101 for annual exam.    Well female exam with routine gynecological exam - Plan: CBC with Differential/Platelet, Comprehensive metabolic panel, Cytology - PAP( ). Education provided on SBEs, importance of preventative screenings, current guidelines, high calcium diet, regular exercise, and multivitamin daily.   Encounter for routine checking of intrauterine contraceptive device (IUD) - Mirena IUD inserted 11/2015. She is aware Mirena is now FDA approved for 8 years. Amenorrheic.  Acne vulgaris - Plan: doxycycline (ORACEA) 40 MG capsule daily. Has taken for years. Originally prescribed by dermatology and was told she can take as needed indefinitely. Provided refill but recommend she see dermatology moving forward for management.   Vaginal candidiasis - Plan: fluconazole (DIFLUCAN) 150 MG tablet today and repeat dose in 3 days.   Screening for cervical cancer - Normal Pap history.  Pap collected today.   Screening for breast cancer - Normal mammogram history.  Continue annual screenings.  Normal breast exam today.  Screening for colon cancer - Has not had screening colonoscopy. Discussed current guidelines and importance or preventative screenings. Information provided on [facility].  Follow up in 1 year for annual    [place], 3:20 PM 06/23/2021

## 2021-06-23 NOTE — Addendum Note (Signed)
Addended byMarny Lowenstein on: 06/23/2021 03:23 PM   Modules accepted: Orders

## 2021-06-24 ENCOUNTER — Telehealth: Payer: Self-pay | Admitting: *Deleted

## 2021-06-24 ENCOUNTER — Other Ambulatory Visit: Payer: Self-pay | Admitting: Nurse Practitioner

## 2021-06-24 DIAGNOSIS — L700 Acne vulgaris: Secondary | ICD-10-CM

## 2021-06-24 LAB — COMPREHENSIVE METABOLIC PANEL
AG Ratio: 1.7 (calc) (ref 1.0–2.5)
ALT: 12 U/L (ref 6–29)
AST: 17 U/L (ref 10–35)
Albumin: 4 g/dL (ref 3.6–5.1)
Alkaline phosphatase (APISO): 50 U/L (ref 37–153)
BUN: 14 mg/dL (ref 7–25)
CO2: 23 mmol/L (ref 20–32)
Calcium: 9.2 mg/dL (ref 8.6–10.4)
Chloride: 106 mmol/L (ref 98–110)
Creat: 0.9 mg/dL (ref 0.50–1.03)
Globulin: 2.3 g/dL (calc) (ref 1.9–3.7)
Glucose, Bld: 93 mg/dL (ref 65–99)
Potassium: 4.4 mmol/L (ref 3.5–5.3)
Sodium: 138 mmol/L (ref 135–146)
Total Bilirubin: 0.5 mg/dL (ref 0.2–1.2)
Total Protein: 6.3 g/dL (ref 6.1–8.1)

## 2021-06-24 LAB — CBC WITH DIFFERENTIAL/PLATELET
Absolute Monocytes: 713 cells/uL (ref 200–950)
Basophils Absolute: 17 cells/uL (ref 0–200)
Basophils Relative: 0.2 %
Eosinophils Absolute: 104 cells/uL (ref 15–500)
Eosinophils Relative: 1.2 %
HCT: 39.8 % (ref 35.0–45.0)
Hemoglobin: 13.2 g/dL (ref 11.7–15.5)
Lymphs Abs: 1757 cells/uL (ref 850–3900)
MCH: 33.3 pg — ABNORMAL HIGH (ref 27.0–33.0)
MCHC: 33.2 g/dL (ref 32.0–36.0)
MCV: 100.5 fL — ABNORMAL HIGH (ref 80.0–100.0)
MPV: 9.2 fL (ref 7.5–12.5)
Monocytes Relative: 8.2 %
Neutro Abs: 6107 cells/uL (ref 1500–7800)
Neutrophils Relative %: 70.2 %
Platelets: 294 10*3/uL (ref 140–400)
RBC: 3.96 10*6/uL (ref 3.80–5.10)
RDW: 11.4 % (ref 11.0–15.0)
Total Lymphocyte: 20.2 %
WBC: 8.7 10*3/uL (ref 3.8–10.8)

## 2021-06-24 LAB — CYTOLOGY - PAP
Comment: NEGATIVE
Diagnosis: NEGATIVE
High risk HPV: NEGATIVE

## 2021-06-24 MED ORDER — DOXYCYCLINE MONOHYDRATE 100 MG PO CAPS: 100.0000 mg | ORAL_CAPSULE | Freq: Every day | ORAL | 2 refills | Status: DC

## 2021-06-24 NOTE — Telephone Encounter (Signed)
I refilled what was in her chart and she confirmed dosage. I have sent Doxycycline 100 mg daily for 3 months. The recommendation is no longer than this for acne management. I refilled generously due to being refilled at our office prior (derm originally prescribed), but it is recommended in the future refills be provided by dermatology. Thank you.

## 2021-06-24 NOTE — Telephone Encounter (Signed)
Patient called was prescribed doxycycline 40 mg capsules yesterday at office. Patient called today asking if Rx could switched to 100 mg dose this is the dose she has taken in past. Insurance won't cover 40 mg dose. Please advise

## 2021-06-24 NOTE — Telephone Encounter (Signed)
Patient informed with all the below.  

## 2021-07-02 ENCOUNTER — Encounter: Payer: Self-pay | Admitting: Nurse Practitioner

## 2021-07-02 ENCOUNTER — Encounter: Payer: Self-pay | Admitting: Internal Medicine

## 2021-07-02 ENCOUNTER — Other Ambulatory Visit: Payer: Self-pay | Admitting: Nurse Practitioner

## 2021-07-02 DIAGNOSIS — Z1231 Encounter for screening mammogram for malignant neoplasm of breast: Secondary | ICD-10-CM

## 2021-07-02 DIAGNOSIS — L700 Acne vulgaris: Secondary | ICD-10-CM

## 2021-07-02 MED ORDER — DOXYCYCLINE MONOHYDRATE 100 MG PO CAPS: 100.0000 mg | ORAL_CAPSULE | Freq: Every day | ORAL | 2 refills | Status: AC

## 2021-07-07 ENCOUNTER — Other Ambulatory Visit: Payer: Self-pay

## 2021-07-07 ENCOUNTER — Ambulatory Visit (AMBULATORY_SURGERY_CENTER): Payer: 59

## 2021-07-07 ENCOUNTER — Encounter: Payer: Self-pay | Admitting: Internal Medicine

## 2021-07-07 VITALS — Ht 62.0 in | Wt 132.0 lb

## 2021-07-07 DIAGNOSIS — Z1211 Encounter for screening for malignant neoplasm of colon: Secondary | ICD-10-CM

## 2021-07-07 MED ORDER — SUTAB 1479-225-188 MG PO TABS: 1.0000 | ORAL_TABLET | ORAL | 0 refills | Status: DC

## 2021-07-07 NOTE — Progress Notes (Signed)
Pre visit completed via phone call; Patient verified name, DOB, and address; No egg or soy allergy known to patient  No issues known to pt with past sedation with any surgeries or procedures Patient denies ever being told they had issues or difficulty with intubation  No FH of Malignant Hyperthermia Pt is not on diet pills Pt is not on  home 02  Pt is not on blood thinners  Pt denies issues with constipation  No A fib or A flutter  EMMI video via MyChart  Pt is fully vaccinated for Covid x 2 Coupon given to pt in PV today, code sent to pharmacy and NO PA's for preps discussed with pt in PV today  Discussed with pt there will be an out-of-pocket cost for prep and that varies from $0 to 70 +  dollars  Due to the COVID-19 pandemic we are asking patients to follow certain guidelines.  Pt aware of COVID protocols and [facility] guidelines

## 2021-07-10 ENCOUNTER — Other Ambulatory Visit: Payer: Self-pay | Admitting: Nurse Practitioner

## 2021-07-10 DIAGNOSIS — F4323 Adjustment disorder with mixed anxiety and depressed mood: Secondary | ICD-10-CM

## 2021-07-13 NOTE — Telephone Encounter (Signed)
Annual exam was on 06/2021

## 2021-07-23 ENCOUNTER — Encounter: Payer: 59 | Admitting: Internal Medicine

## 2021-08-07 ENCOUNTER — Other Ambulatory Visit: Payer: Self-pay

## 2021-08-07 ENCOUNTER — Ambulatory Visit
Admission: RE | Admit: 2021-08-07 | Discharge: 2021-08-07 | Disposition: A | Discharge status: Home / Self Care | Payer: 59 | Source: Ambulatory Visit | Attending: Nurse Practitioner | Admitting: Nurse Practitioner

## 2021-08-07 DIAGNOSIS — Z1231 Encounter for screening mammogram for malignant neoplasm of breast: Secondary | ICD-10-CM

## 2021-09-11 ENCOUNTER — Encounter: Payer: Self-pay | Admitting: Internal Medicine

## 2021-09-11 ENCOUNTER — Other Ambulatory Visit: Payer: Self-pay

## 2021-09-11 ENCOUNTER — Ambulatory Visit (AMBULATORY_SURGERY_CENTER): Payer: 59 | Admitting: Internal Medicine

## 2021-09-11 VITALS — BP 110/66 | HR 62 | Temp 97.8°F | Resp 18 | Ht 62.0 in | Wt 132.0 lb

## 2021-09-11 DIAGNOSIS — Z1211 Encounter for screening for malignant neoplasm of colon: Secondary | ICD-10-CM | POA: Diagnosis present

## 2021-09-11 MED ORDER — SODIUM CHLORIDE 0.9 % IV SOLN: 500.0000 mL | Freq: Once | INTRAVENOUS | Status: DC

## 2021-09-11 NOTE — Progress Notes (Signed)
VS-CW  Pt's states no medical or surgical changes since previsit or office visit.  

## 2021-09-11 NOTE — Progress Notes (Signed)
GASTROENTEROLOGY PROCEDURE H&P NOTE   Primary Care Physician: Patient, No Pcp Per (Inactive)    Reason for Procedure:   Colon cancer screening  Plan:    Colonoscopy  Patient is appropriate for endoscopic procedure(s) in the ambulatory ([place]) setting.  The nature of the procedure, as well as the risks, benefits, and alternatives were carefully and thoroughly reviewed with the patient. Ample time for discussion and questions allowed. The patient understood, was satisfied, and agreed to proceed.     HPI: Katelyn Delgado is a 50 y.o. female who presents for colonoscopy for colon cancer screening. Denies blood in stools, changes in bowel habits, weight loss. Denies fam hx of colon cancer. Denies blood thinners.  Past Medical History:  Diagnosis Date   Acne    GERD (gastroesophageal reflux disease)    with certain foods   Heart murmur    dx in childhood   History of hematuria    MVP (mitral valve prolapse)    Seasonal allergies    Spontaneous vaginal delivery    premie 28 weeks    Past Surgical History:  Procedure Laterality Date   INTRAUTERINE DEVICE INSERTION  11/28/2015   Mirena   WISDOM TOOTH EXTRACTION  1989    Prior to Admission medications   Medication Sig Start Date End Date Taking? Authorizing Provider  buPROPion (WELLBUTRIN XL) 300 MG 24 hr tablet TAKE ONE TABLET BY MOUTH DAILY 07/13/21  Yes Marny Lowenstein A, NP  doxycycline (MONODOX) 100 MG capsule Take 1 capsule (100 mg total) by mouth daily. 07/02/21 09/30/21 Yes Wallace, Tiffany A, NP  aluminum chloride (DRYSOL) 20 % external solution Apply topically at bedtime. Once excessive sweating has stopped, can apply 1-2 x a week. 11/11/20   Salvadore Dom, MD  LYSINE PO Take 1 tablet by mouth daily at 6 (six) AM.    [provider]    Current Outpatient Medications  Medication Sig Dispense Refill   buPROPion (WELLBUTRIN XL) 300 MG 24 hr tablet TAKE ONE TABLET BY MOUTH DAILY 90 tablet 3    doxycycline (MONODOX) 100 MG capsule Take 1 capsule (100 mg total) by mouth daily. 30 capsule 2   aluminum chloride (DRYSOL) 20 % external solution Apply topically at bedtime. Once excessive sweating has stopped, can apply 1-2 x a week. 35 mL 1   LYSINE PO Take 1 tablet by mouth daily at 6 (six) AM.     Current Facility-Administered Medications  Medication Dose Route Frequency Provider Last Rate Last Admin   0.9 %  sodium chloride infusion  500 mL Intravenous Once Sharyn Creamer, MD        Allergies as of 09/11/2021 - Review Complete 09/11/2021  Allergen Reaction Noted   Sulfa antibiotics Other (See Comments) 04/22/2011    Family History  Problem Relation Age of Onset   Hypertension Mother    Breast cancer Maternal Aunt        early 53's   Colon polyps Neg Hx    Colon cancer Neg Hx    Esophageal cancer Neg Hx    Stomach cancer Neg Hx    Rectal cancer Neg Hx     Social History   Socioeconomic History   Marital status: Married    Spouse name: Not on file   Number of children: Not on file   Years of education: Not on file   Highest education level: Not on file  Occupational History   Not on file  Tobacco Use   Smoking  status: Former    Types: Cigarettes    Quit date: 10/04/2016    Years since quitting: 4.9   Smokeless tobacco: Never  Vaping Use   Vaping Use: Never used  Substance and Sexual Activity   Alcohol use: Yes    Alcohol/week: 10.0 standard drinks    Types: 10 Standard drinks or equivalent per week   Drug use: No   Sexual activity: Yes    Birth control/protection: I.U.D.    Comment: Mirena 11/28/2015  1st intercourse 16, NO MORE THAN 5 PARTNERS  Other Topics Concern   Not on file  Social History Narrative   Not on file   Social Determinants of Health   Financial Resource Strain: Not on file  Food Insecurity: Not on file  Transportation Needs: Not on file  Physical Activity: Not on file  Stress: Not on file  Social Connections: Not on file  Intimate  Partner Violence: Not on file    Physical Exam: Vital signs in last 24 hours: BP 117/84   Pulse 74   Temp 97.8 F (36.6 C)   Ht 5\' 2"  (1.575 m)   Wt 132 lb (59.9 kg)   SpO2 98%   BMI 24.14 kg/m  GEN: NAD EYE: Sclerae anicteric ENT: MMM CV: Non-tachycardic Pulm: No increased work of breathing GI: Soft, NT/ND NEURO:  Alert & Oriented   [name], MD [facility]  09/11/2021 8:19 AM

## 2021-09-11 NOTE — Op Note (Signed)
[place] Patient Name: Katelyn Delgado Procedure Date: 09/11/2021 8:02 AM MRN: [id number] Endoscopist: Sonny Masters "Katelyn Delgado ,  Age: 50 Referring MD:  Date of Birth: 1971-06-04 Gender: Female Account #: [id number] Procedure:                Colonoscopy Indications:              Screening for colorectal malignant neoplasm, This                            is the patient's first colonoscopy Medicines:                Monitored Anesthesia Care Procedure:                Pre-Anesthesia Assessment:                           - Prior to the procedure, a History and Physical                            was performed, and patient medications and                            allergies were reviewed. The patient's tolerance of                            previous anesthesia was also reviewed. The risks                            and benefits of the procedure and the sedation                            options and risks were discussed with the patient.                            All questions were answered, and informed consent                            was obtained. Prior Anticoagulants: The patient has                            taken no previous anticoagulant or antiplatelet                            agents. ASA Grade Assessment: II - A patient with                            mild systemic disease. After reviewing the risks                            and benefits, the patient was deemed in                            satisfactory condition to undergo the procedure.  After obtaining informed consent, the colonoscope                            was passed under direct vision. Throughout the                            procedure, the patient's blood pressure, pulse, and                            oxygen saturations were monitored continuously. The                            CF [id number] #[id number] was introduced through the anus                            and advanced to the  the terminal ileum. The                            colonoscopy was performed without difficulty. The                            patient tolerated the procedure well. The quality                            of the bowel preparation was adequate. The terminal                            ileum, ileocecal valve, appendiceal orifice, and                            rectum were photographed. Scope In: 8:21:59 AM Scope Out: 8:39:36 AM Scope Withdrawal Time: 0 hours 13 minutes 22 seconds  Total Procedure Duration: 0 hours 17 minutes 37 seconds  Findings:                 The terminal ileum appeared normal.                           A few diverticula were found in the sigmoid colon.                           Non-bleeding internal hemorrhoids were found during                            retroflexion.                           The exam was otherwise without abnormality. Complications:            No immediate complications. Estimated Blood Loss:     Estimated blood loss: none. Impression:               - The examined portion of the ileum was normal.                           - Diverticulosis in the  sigmoid colon.                           - Non-bleeding internal hemorrhoids.                           - The examination was otherwise normal.                           - No specimens collected. Recommendation:           - Discharge patient to home (with escort).                           - Repeat colonoscopy in 10 years for screening                            purposes.                           - The findings and recommendations were discussed                            with the patient. Sonny Masters "Katelyn Delgado,  09/11/2021 8:46:11 AM

## 2021-09-11 NOTE — Patient Instructions (Signed)
Read all of the handouts given to you by your recovery room nurse.  YOU HAD AN ENDOSCOPIC PROCEDURE TODAY AT [facility]:   Refer to the procedure report that was given to you for any specific questions about what was found during the examination.  If the procedure report does not answer your questions, please call your gastroenterologist to clarify.  If you requested that your care partner not be given the details of your procedure findings, then the procedure report has been included in a sealed envelope for you to review at your convenience later.  YOU SHOULD EXPECT: Some feelings of bloating in the abdomen. Passage of more gas than usual.  Walking can help get rid of the air that was put into your GI tract during the procedure and reduce the bloating. If you had a lower endoscopy (such as a colonoscopy or flexible sigmoidoscopy) you may notice spotting of blood in your stool or on the toilet paper. If you underwent a bowel prep for your procedure, you may not have a normal bowel movement for a few days.  Please Note:  You might notice some irritation and congestion in your nose or some drainage.  This is from the oxygen used during your procedure.  There is no need for concern and it should clear up in a day or so.  SYMPTOMS TO REPORT IMMEDIATELY:  Following lower endoscopy (colonoscopy or flexible sigmoidoscopy):  Excessive amounts of blood in the stool  Significant tenderness or worsening of abdominal pains  Swelling of the abdomen that is new, acute  Fever of 100F or higher   For urgent or emergent issues, a gastroenterologist can be reached at any hour by calling [phone]. Do not use MyChart messaging for urgent concerns.    DIET:  We do recommend a small meal at first, but then you may proceed to your regular diet.  Drink plenty of fluids but you should avoid alcoholic beverages for 24 hours.  ACTIVITY:  You should plan to take it easy for the rest of today and  you should NOT DRIVE or use heavy machinery until tomorrow (because of the sedation medicines used during the test).    FOLLOW UP: Our staff will call the number listed on your records 48-72 hours following your procedure to check on you and address any questions or concerns that you may have regarding the information given to you following your procedure. If we do not reach you, we will leave a message.  We will attempt to reach you two times.  During this call, we will ask if you have developed any symptoms of COVID 19. If you develop any symptoms (ie: fever, flu-like symptoms, shortness of breath, cough etc.) before then, please call [phone].  If you test positive for Covid 19 in the 2 weeks post procedure, please call and report this information to [place].     SIGNATURES/CONFIDENTIALITY: You and/or your care partner have signed paperwork which will be entered into your electronic medical record.  These signatures attest to the fact that that the information above on your After Visit Summary has been reviewed and is understood.  Full responsibility of the confidentiality of this discharge information lies with you and/or your care-partner.

## 2021-09-15 ENCOUNTER — Telehealth: Payer: Self-pay

## 2021-09-15 ENCOUNTER — Telehealth: Payer: Self-pay | Admitting: *Deleted

## 2021-09-15 NOTE — Telephone Encounter (Signed)
Left message on follow up call. 

## 2021-09-15 NOTE — Telephone Encounter (Signed)
Attempted 2nd f/u phone call. No answer. Left message.  °

## 2022-06-28 ENCOUNTER — Ambulatory Visit (INDEPENDENT_AMBULATORY_CARE_PROVIDER_SITE_OTHER): Payer: 59 | Admitting: Nurse Practitioner

## 2022-06-28 ENCOUNTER — Encounter: Payer: Self-pay | Admitting: Nurse Practitioner

## 2022-06-28 VITALS — BP 118/78 | HR 72 | Ht 63.5 in | Wt 139.0 lb

## 2022-06-28 DIAGNOSIS — E785 Hyperlipidemia, unspecified: Secondary | ICD-10-CM

## 2022-06-28 DIAGNOSIS — M79671 Pain in right foot: Secondary | ICD-10-CM | POA: Diagnosis not present

## 2022-06-28 DIAGNOSIS — Z30431 Encounter for routine checking of intrauterine contraceptive device: Secondary | ICD-10-CM | POA: Diagnosis not present

## 2022-06-28 DIAGNOSIS — Z01419 Encounter for gynecological examination (general) (routine) without abnormal findings: Secondary | ICD-10-CM | POA: Diagnosis not present

## 2022-06-28 DIAGNOSIS — N951 Menopausal and female climacteric states: Secondary | ICD-10-CM

## 2022-06-28 DIAGNOSIS — M79672 Pain in left foot: Secondary | ICD-10-CM

## 2022-06-28 MED ORDER — ESTRADIOL 0.0375 MG/24HR TD PTWK
0.0375 mg | MEDICATED_PATCH | TRANSDERMAL | 1 refills | Status: DC
Start: 2022-06-28 — End: 2022-10-19

## 2022-06-28 NOTE — Progress Notes (Signed)
Katelyn Delgado [birth year] [id number]   History:  51 y.o. G1P0101 presents for annual exam. Mirena IUD 11/2015, amenorrheic. Reports hot flashes, mood swings, and acne. She has severe anxiety at times, especially with driving. Normal pap history. Reports bilateral foot pain that is described as tingling/burning. Is being treated for plantar fascitis.   Gynecologic History No LMP recorded. (Menstrual status: IUD).   Contraception: IUD Sexually active: Yes  Health Maintenance Last Pap: 06/23/2021. Results were: Normal neg HPV Last mammogram: 08/07/2021. Results were: Normal Last colonoscopy: 09/11/2021. Results were: Normal, 10-year recall Last Dexa: Not indicated  Past medical history, past surgical history, family history and social history were all reviewed and documented in the EPIC chart. Married. Works at [employer]. 11 year old daughter.   ROS:  A ROS was performed and pertinent positives and negatives are included.  Exam:  Vitals:   06/28/22 0754  BP: 118/78  Pulse: 72  SpO2: 98%  Weight: 139 lb (63 kg)  Height: 5' 3.5" (1.613 m)     Body mass index is 24.24 kg/m.  General appearance:  Normal Thyroid:  Symmetrical, normal in size, without palpable masses or nodularity. Respiratory  Auscultation:  Clear without wheezing or rhonchi Cardiovascular  Auscultation:  Regular rate, without rubs, murmurs or gallops  Edema/varicosities:  Not grossly evident Abdominal  Soft,nontender, without masses, guarding or rebound.  Liver/spleen:  No organomegaly noted  Hernia:  None appreciated  Skin  Inspection:  Grossly normal   Breasts: Examined lying and sitting.   Right: Without masses, retractions, discharge or axillary adenopathy.   Left: Without masses, retractions, discharge or axillary adenopathy. Genitourinary   Inguinal/mons:  Normal without inguinal adenopathy  External genitalia:  Normal appearing vulva with no masses, tenderness, or  lesions  BUS/Urethra/Skene's glands:  Normal  Vagina:  Normal appearing with normal color and discharge, no lesions  Cervix:  Normal appearing without discharge or lesions. IUD string visible at external os  Uterus:  Normal in size, shape and contour.  Midline and mobile, nontender  Adnexa/parametria:     Rt: Normal in size, without masses or tenderness.   Lt: Normal in size, without masses or tenderness.  Anus and perineum: Normal  Digital rectal exam: Normal sphincter tone without palpated masses or tenderness  Patient informed chaperone available to be present for breast and pelvic exam. Patient has requested no chaperone to be present. Patient has been advised what will be completed during breast and pelvic exam.   Assessment/Plan:  51 y.o. G1P0101 for annual exam.    Well female exam with routine gynecological exam - Plan: CBC with Differential/Platelet, Comprehensive metabolic panel, Hemoglobin A1c. Education provided on SBEs, importance of preventative screenings, current guidelines, high calcium diet, regular exercise, and multivitamin daily. Recommend finding PCP.   Encounter for routine checking of intrauterine contraceptive device (IUD) - Mirena IUD inserted 11/2015. Aware of 8-year FDA approval. Amenorrheic.   Hyperlipidemia, unspecified hyperlipidemia type - Plan: Lipid panel  Menopausal symptoms - Plan: estradiol (CLIMARA) 0.0375 mg/24hr patch weekly. Reports hot flashes, mood swings, and acne. She has severe anxiety at times, especially with driving. Discussed management with OTC supplements, SSRI/SNRI, and hormone replacement. She wants to do HRT. Discussed risks and benefits of use. Mirena IUD for endometrial protection.   Bilateral foot pain - Plan: Hemoglobin A1c. Reports burning/tingling in both feet. Has been getting injections for management of plantar fascitis. Would like A1C checked today.   Screening for cervical cancer - Normal Pap history.  Will repeat  at 5-year  interval per guidelines.   Screening for breast cancer - Normal mammogram history.  Continue annual screenings.  Normal breast exam today.  Screening for colon cancer - 09/2021 colonoscopy. Will repeat at 10 year interval per GI recommendation.   Follow up in 1 year for annual.    Tamela [facility] 8:13 AM 06/28/2022

## 2022-06-29 ENCOUNTER — Other Ambulatory Visit: Payer: Self-pay | Admitting: Nurse Practitioner

## 2022-06-29 ENCOUNTER — Encounter: Payer: Self-pay | Admitting: Nurse Practitioner

## 2022-06-29 DIAGNOSIS — F4323 Adjustment disorder with mixed anxiety and depressed mood: Secondary | ICD-10-CM

## 2022-06-29 LAB — CBC WITH DIFFERENTIAL/PLATELET
Absolute Monocytes: 570 cells/uL (ref 200–950)
Basophils Absolute: 38 cells/uL (ref 0–200)
Basophils Relative: 0.6 %
Eosinophils Absolute: 90 cells/uL (ref 15–500)
Eosinophils Relative: 1.4 %
HCT: 39.8 % (ref 35.0–45.0)
Hemoglobin: 13.6 g/dL (ref 11.7–15.5)
Lymphs Abs: 1837 cells/uL (ref 850–3900)
MCH: 34 pg — ABNORMAL HIGH (ref 27.0–33.0)
MCHC: 34.2 g/dL (ref 32.0–36.0)
MCV: 99.5 fL (ref 80.0–100.0)
MPV: 9.4 fL (ref 7.5–12.5)
Monocytes Relative: 8.9 %
Neutro Abs: 3866 cells/uL (ref 1500–7800)
Neutrophils Relative %: 60.4 %
Platelets: 296 10*3/uL (ref 140–400)
RBC: 4 10*6/uL (ref 3.80–5.10)
RDW: 11.8 % (ref 11.0–15.0)
Total Lymphocyte: 28.7 %
WBC: 6.4 10*3/uL (ref 3.8–10.8)

## 2022-06-29 LAB — COMPREHENSIVE METABOLIC PANEL
AG Ratio: 1.7 (calc) (ref 1.0–2.5)
ALT: 12 U/L (ref 6–29)
AST: 15 U/L (ref 10–35)
Albumin: 4.2 g/dL (ref 3.6–5.1)
Alkaline phosphatase (APISO): 47 U/L (ref 37–153)
BUN: 12 mg/dL (ref 7–25)
CO2: 25 mmol/L (ref 20–32)
Calcium: 9.3 mg/dL (ref 8.6–10.4)
Chloride: 103 mmol/L (ref 98–110)
Creat: 0.89 mg/dL (ref 0.50–1.03)
Globulin: 2.5 g/dL (calc) (ref 1.9–3.7)
Glucose, Bld: 82 mg/dL (ref 65–99)
Potassium: 4.9 mmol/L (ref 3.5–5.3)
Sodium: 136 mmol/L (ref 135–146)
Total Bilirubin: 0.3 mg/dL (ref 0.2–1.2)
Total Protein: 6.7 g/dL (ref 6.1–8.1)

## 2022-06-29 LAB — LIPID PANEL
Cholesterol: 208 mg/dL — ABNORMAL HIGH (ref <200)
HDL: 68 mg/dL (ref >=50)
LDL Cholesterol (Calc): 111 mg/dL (calc) — ABNORMAL HIGH
Non-HDL Cholesterol (Calc): 140 mg/dL (calc) — ABNORMAL HIGH (ref <130)
Total CHOL/HDL Ratio: 3.1 (calc) (ref <5.0)
Triglycerides: 172 mg/dL — ABNORMAL HIGH (ref <150)

## 2022-06-29 LAB — HEMOGLOBIN A1C
Hgb A1c MFr Bld: 5.2 % of total Hgb (ref <5.7)
Mean Plasma Glucose: 103 mg/dL
eAG (mmol/L): 5.7 mmol/L

## 2022-06-29 MED ORDER — BUPROPION HCL ER (XL) 300 MG PO TB24: 300.0000 mg | ORAL_TABLET | Freq: Every day | ORAL | 3 refills | Status: AC

## 2022-08-30 ENCOUNTER — Encounter: Payer: Self-pay | Admitting: Nurse Practitioner

## 2022-10-18 ENCOUNTER — Other Ambulatory Visit: Payer: Self-pay | Admitting: Nurse Practitioner

## 2022-10-18 DIAGNOSIS — N951 Menopausal and female climacteric states: Secondary | ICD-10-CM

## 2022-10-19 NOTE — Telephone Encounter (Signed)
Med refill request: Climara 0.0375 mg patch weekly Last AEX: 06/28/22/ TW Next AEX: Not scheduled  Last MMG (if hormonal med) 08/20/22, Birads 1 neg  Rx pended #16/ 2RF Refill authorized: Please Advise?

## 2023-04-17 IMAGING — MG MM DIGITAL SCREENING BILAT W/ TOMO AND CAD
8 series · 9 of 24 positions shown · non-contrast
Comparison: Previous exam(s).

CLINICAL DATA: Screening.

EXAM:
DIGITAL SCREENING BILATERAL MAMMOGRAM WITH TOMOSYNTHESIS AND CAD
TECHNIQUE: Bilateral screening digital craniocaudal and mediolateral oblique
mammograms were obtained. Bilateral screening digital breast
tomosynthesis was performed. The images were evaluated with
computer-aided detection.

[L CC synth-2D]
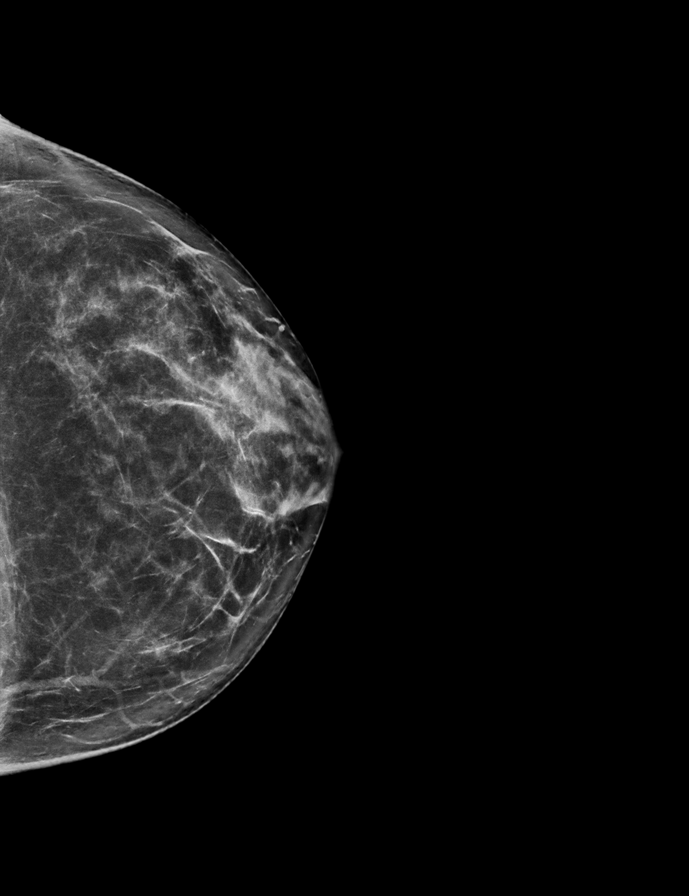

[R CC synth-2D]
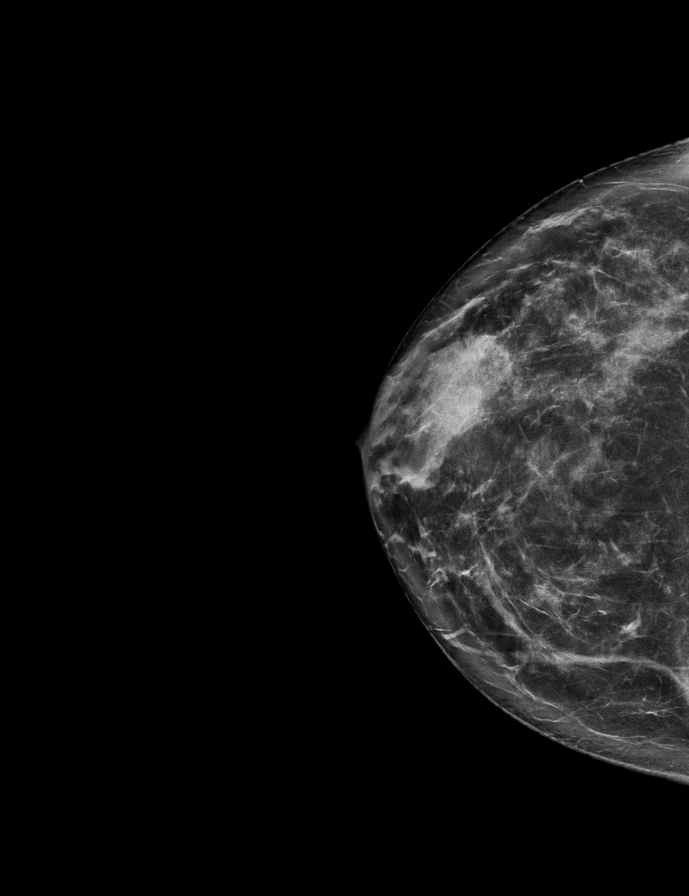

[L MLO synth-2D]
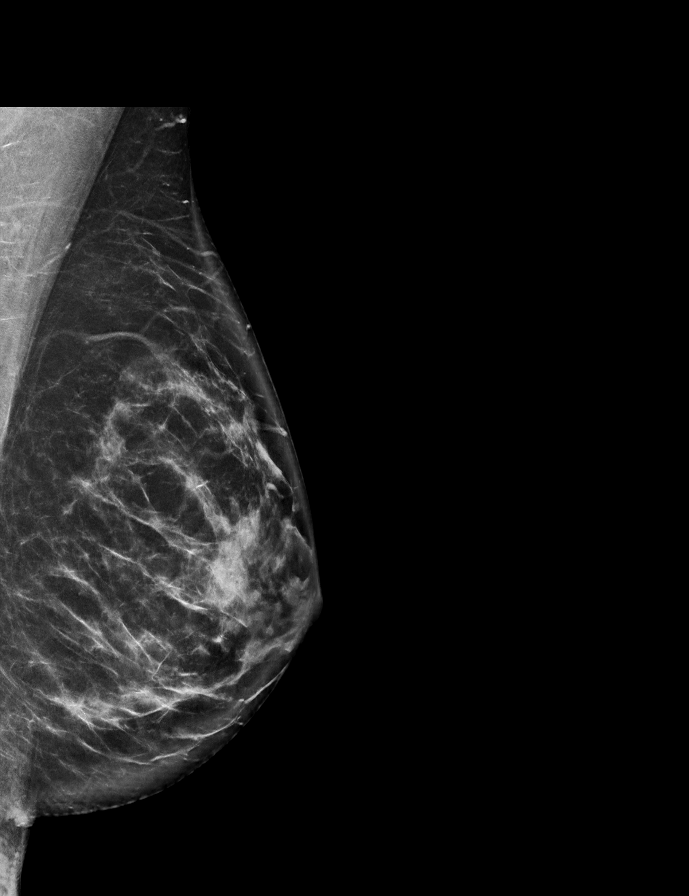

[R MLO synth-2D]
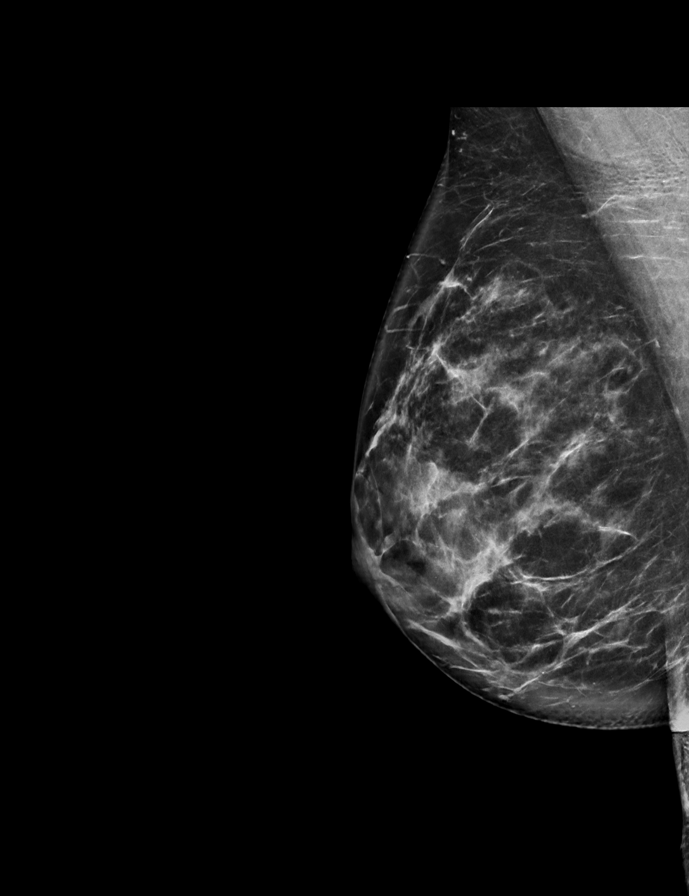

[L MLO tomo · 2 of 83 frames shown]
[frame 27/83]
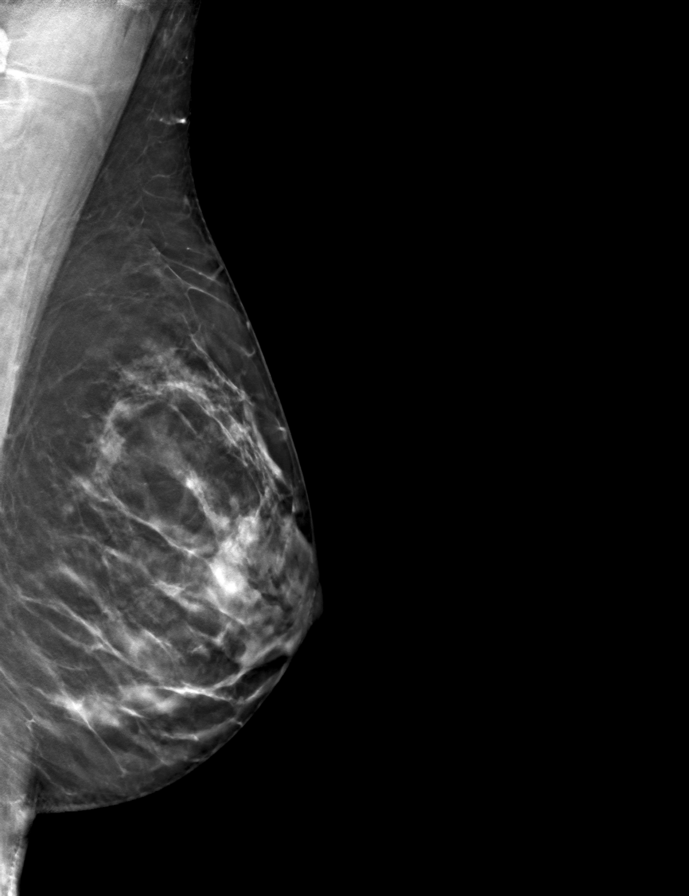
[frame 42/83]
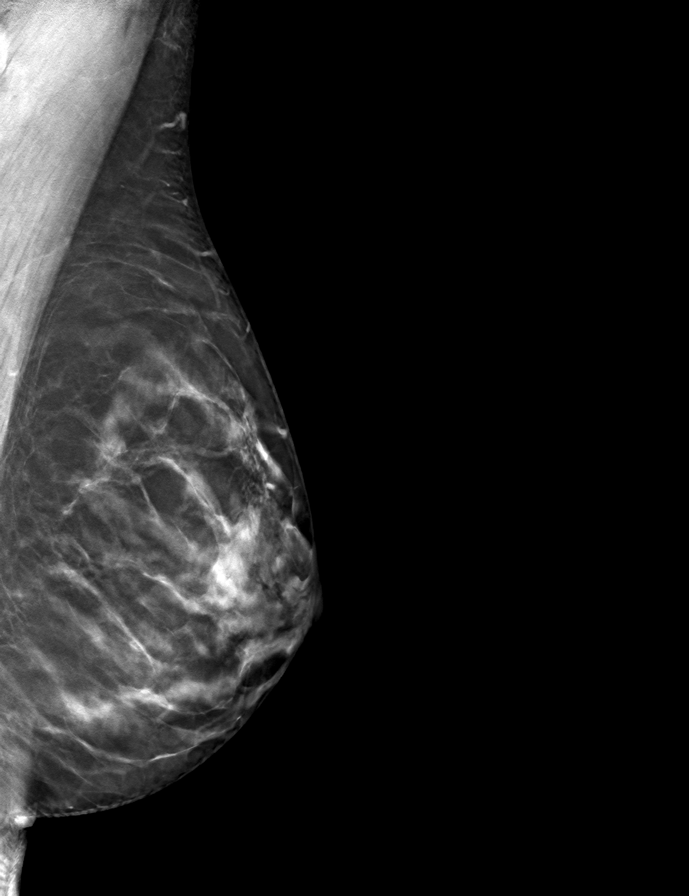

[L CC tomo · tomo slice 38/75.0]
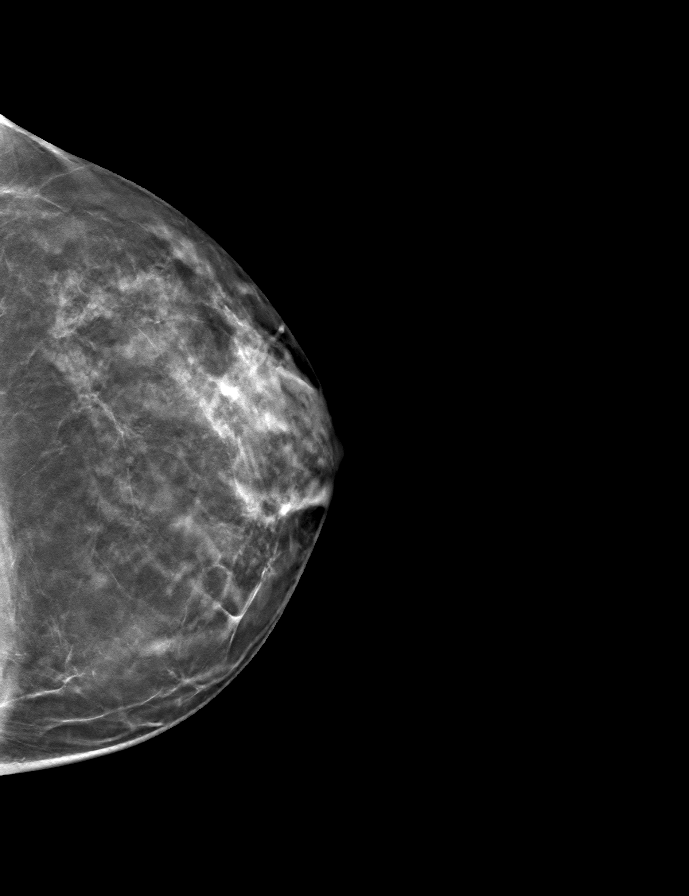

[R MLO tomo · tomo slice 43/85.0]
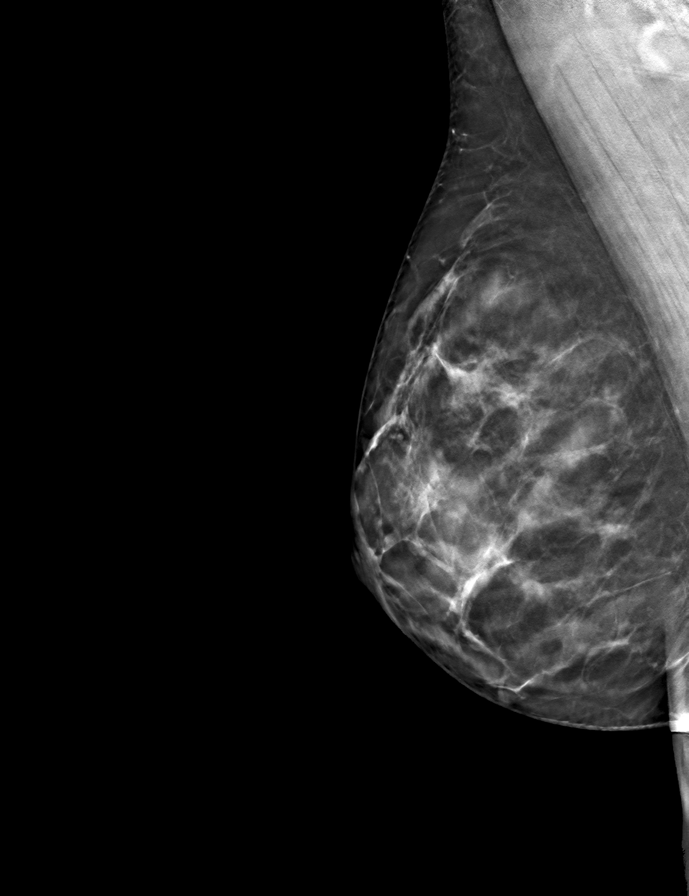

[R CC tomo · tomo slice 39/77.0]
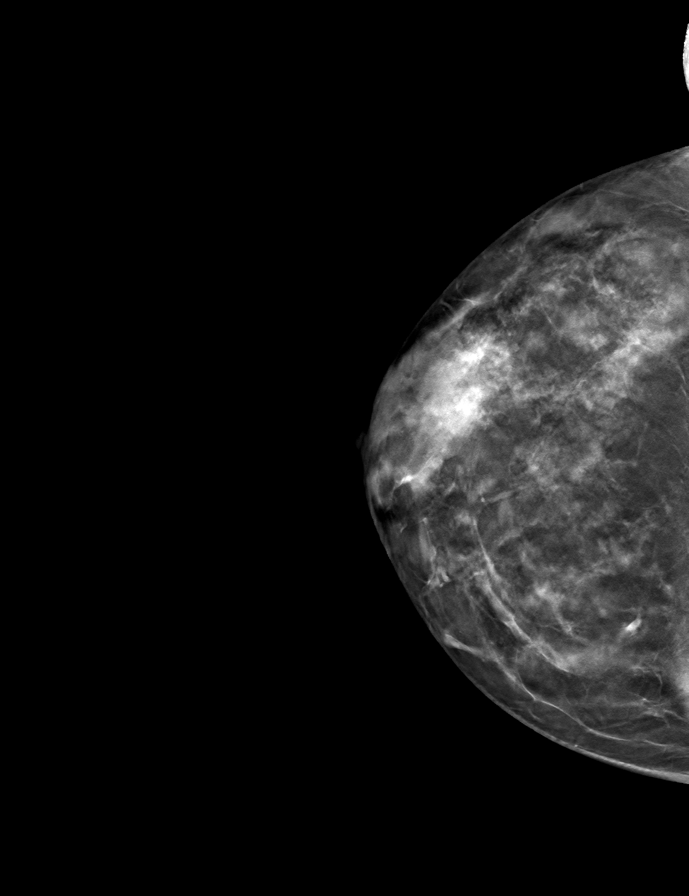

[9 of 24 positions shown; findings below may reference images not displayed]

ACR Breast Density Category c: The breast tissue is heterogeneously
dense, which may obscure small masses.
FINDINGS: There are no findings suspicious for malignancy.
IMPRESSION: No mammographic evidence of malignancy. A result letter of this
screening mammogram will be mailed directly to the patient.

RECOMMENDATION:
Screening mammogram in one year. (Code:Q3-W-BC3)

BI-RADS CATEGORY  1: Negative.

## 2023-07-05 ENCOUNTER — Ambulatory Visit: Payer: 59 | Admitting: Nurse Practitioner

## 2023-07-11 ENCOUNTER — Ambulatory Visit (INDEPENDENT_AMBULATORY_CARE_PROVIDER_SITE_OTHER): Payer: 59 | Admitting: Nurse Practitioner

## 2023-07-11 ENCOUNTER — Encounter: Payer: Self-pay | Admitting: Nurse Practitioner

## 2023-07-11 VITALS — BP 132/82 | HR 75 | Ht 62.75 in | Wt 138.0 lb

## 2023-07-11 DIAGNOSIS — Z01419 Encounter for gynecological examination (general) (routine) without abnormal findings: Secondary | ICD-10-CM

## 2023-07-11 DIAGNOSIS — Z30431 Encounter for routine checking of intrauterine contraceptive device: Secondary | ICD-10-CM

## 2023-07-11 DIAGNOSIS — N951 Menopausal and female climacteric states: Secondary | ICD-10-CM

## 2023-07-11 DIAGNOSIS — E785 Hyperlipidemia, unspecified: Secondary | ICD-10-CM

## 2023-07-11 NOTE — Progress Notes (Unsigned)
Katelyn Delgado 17-Nov-1970 [id number]   History:  52 y.o. G1P0101 presents for annual exam. Mirena IUD 11/2015, amenorrheic but has had some spotting recently. Started on ERT last year for vasomotor symptoms and anxiety. Stopped 1-2 months ago because she felt it was not helping. Having significant hot flashes and night sweats. Normal pap history. PCP increased Wellbutrin and anxiety has improved some.   Gynecologic History No LMP recorded. (Menstrual status: IUD).   Contraception: IUD Sexually active: Yes  Health Maintenance Last Pap: 06/23/2021. Results were: Normal neg HPV, 5-year repeat Last mammogram: 08/20/2022. Results were: Normal Last colonoscopy: 09/11/2021. Results were: Normal, 10-year recall Last Dexa: Not indicated  Past medical history, past surgical history, family history and social history were all reviewed and documented in the EPIC chart. Married. Works at [employer]. 20 year old daughter, senior in HS.   ROS:  A ROS was performed and pertinent positives and negatives are included.  Exam:  Vitals:   07/11/23 1553  BP: 132/82  Pulse: 75  SpO2: 98%  Weight: 138 lb (62.6 kg)  Height: 5' 2.75" (1.594 m)      Body mass index is 24.64 kg/m.  General appearance:  Normal Thyroid:  Symmetrical, normal in size, without palpable masses or nodularity. Respiratory  Auscultation:  Clear without wheezing or rhonchi Cardiovascular  Auscultation:  Regular rate, without rubs, murmurs or gallops  Edema/varicosities:  Not grossly evident Abdominal  Soft,nontender, without masses, guarding or rebound.  Liver/spleen:  No organomegaly noted  Hernia:  None appreciated  Skin  Inspection:  Grossly normal   Breasts: Examined lying and sitting.   Right: Without masses, retractions, discharge or axillary adenopathy.   Left: Without masses, retractions, discharge or axillary adenopathy. Pelvic: External genitalia:  no lesions              Urethra:  normal appearing  urethra with no masses, tenderness or lesions              Bartholins and Skenes: normal                 Vagina: normal appearing vagina with normal color and discharge, no lesions              Cervix: no lesions. IUD string visible Bimanual Exam:  Uterus:  no masses or tenderness              Adnexa: no mass, fullness, tenderness              Rectovaginal: Deferred              Anus:  normal, no lesions   Patient informed chaperone available to be present for breast and pelvic exam. Patient has requested no chaperone to be present. Patient has been advised what will be completed during breast and pelvic exam.   Assessment/Plan:  52 y.o. G1P0101 for annual exam.    Well female exam with routine gynecological exam - Plan: CBC with Differential/Platelet, Comprehensive metabolic panel. Education provided on SBEs, importance of preventative screenings, current guidelines, high calcium diet, regular exercise, and multivitamin daily. Will return for fasting labs.   Vasomotor symptoms due to menopause - Plan: Estradiol, Follicle stimulating hormone. Aware levels may fluctuate during perimenopause. If pre-menopausal she wants to exchange IUD in February. Interested in [place]. Did not notice improvement with HRT.   Encounter for routine checking of intrauterine contraceptive device (IUD) - Mirena IUD inserted 11/2015. Aware of 8-year FDA approval. Amenorrheic. Recent spotting likely from  ERT discontinuation.   Hyperlipidemia, unspecified hyperlipidemia type - Plan: Lipid panel  Screening for cervical cancer - Normal Pap history.  Will repeat at 5-year interval per guidelines.   Screening for breast cancer - Normal mammogram history.  Continue annual screenings.  Normal breast exam today.  Screening for colon cancer - 09/2021 colonoscopy. Will repeat at 10-year interval per GI recommendation.   Follow up in 1 year for annual.    Olivia Mackie [facility], 4:20 PM 07/11/2023

## 2023-07-12 ENCOUNTER — Encounter: Payer: Self-pay | Admitting: Nurse Practitioner

## 2023-07-12 MED ORDER — VEOZAH 45 MG PO TABS: 1.0000 | ORAL_TABLET | Freq: Every day | ORAL | 2 refills | Status: DC

## 2023-07-13 ENCOUNTER — Encounter: Payer: Self-pay | Admitting: Nurse Practitioner

## 2023-07-13 ENCOUNTER — Telehealth: Payer: Self-pay

## 2023-07-13 ENCOUNTER — Other Ambulatory Visit: Payer: 59

## 2023-07-13 DIAGNOSIS — Z01419 Encounter for gynecological examination (general) (routine) without abnormal findings: Secondary | ICD-10-CM

## 2023-07-13 DIAGNOSIS — E785 Hyperlipidemia, unspecified: Secondary | ICD-10-CM

## 2023-07-13 DIAGNOSIS — N951 Menopausal and female climacteric states: Secondary | ICD-10-CM

## 2023-07-13 NOTE — Telephone Encounter (Signed)
PA initiated through CoverMyMeds by pharmacy for [facility] Rx.  Key: [id number]  PA sent to insurance for consideration of coverage.

## 2023-07-13 NOTE — Telephone Encounter (Signed)
Approval for PA for [facility] rx was received from insurance through CoverMyMeds.   LDVM on machine per DPR notifying pt of PA process and outcome of medication approval. Routing to provider for final review and closing encounter.

## 2023-07-14 LAB — CBC WITH DIFFERENTIAL/PLATELET
Absolute Monocytes: 513 {cells}/uL (ref 200–950)
Basophils Absolute: 30 {cells}/uL (ref 0–200)
Basophils Relative: 0.5 %
Eosinophils Absolute: 130 {cells}/uL (ref 15–500)
Eosinophils Relative: 2.2 %
HCT: 42 % (ref 35.0–45.0)
Hemoglobin: 13.9 g/dL (ref 11.7–15.5)
Lymphs Abs: 1522 {cells}/uL (ref 850–3900)
MCH: 33.3 pg — ABNORMAL HIGH (ref 27.0–33.0)
MCHC: 33.1 g/dL (ref 32.0–36.0)
MCV: 100.5 fL — ABNORMAL HIGH (ref 80.0–100.0)
MPV: 9.6 fL (ref 7.5–12.5)
Monocytes Relative: 8.7 %
Neutro Abs: 3705 {cells}/uL (ref 1500–7800)
Neutrophils Relative %: 62.8 %
Platelets: 303 10*3/uL (ref 140–400)
RBC: 4.18 10*6/uL (ref 3.80–5.10)
RDW: 11.1 % (ref 11.0–15.0)
Total Lymphocyte: 25.8 %
WBC: 5.9 10*3/uL (ref 3.8–10.8)

## 2023-07-14 LAB — COMPREHENSIVE METABOLIC PANEL
AG Ratio: 1.8 (calc) (ref 1.0–2.5)
ALT: 16 U/L (ref 6–29)
AST: 16 U/L (ref 10–35)
Albumin: 4.1 g/dL (ref 3.6–5.1)
Alkaline phosphatase (APISO): 54 U/L (ref 37–153)
BUN: 12 mg/dL (ref 7–25)
CO2: 26 mmol/L (ref 20–32)
Calcium: 9.3 mg/dL (ref 8.6–10.4)
Chloride: 104 mmol/L (ref 98–110)
Creat: 0.89 mg/dL (ref 0.50–1.03)
Globulin: 2.3 g/dL (ref 1.9–3.7)
Glucose, Bld: 90 mg/dL (ref 65–99)
Potassium: 4.2 mmol/L (ref 3.5–5.3)
Sodium: 139 mmol/L (ref 135–146)
Total Bilirubin: 0.3 mg/dL (ref 0.2–1.2)
Total Protein: 6.4 g/dL (ref 6.1–8.1)

## 2023-07-14 LAB — LIPID PANEL
Cholesterol: 198 mg/dL (ref <200)
HDL: 73 mg/dL (ref >=50)
LDL Cholesterol (Calc): 111 mg/dL — ABNORMAL HIGH
Non-HDL Cholesterol (Calc): 125 mg/dL (ref <130)
Total CHOL/HDL Ratio: 2.7 (calc) (ref <5.0)
Triglycerides: 56 mg/dL (ref <150)

## 2023-07-14 LAB — ESTRADIOL: Estradiol: 92 pg/mL

## 2023-07-14 LAB — FOLLICLE STIMULATING HORMONE: FSH: 21.4 m[IU]/mL

## 2023-07-14 NOTE — Telephone Encounter (Signed)
Result note sent 

## 2023-08-29 ENCOUNTER — Encounter: Payer: Self-pay | Admitting: Nurse Practitioner

## 2023-11-22 ENCOUNTER — Encounter: Payer: Self-pay | Admitting: Nurse Practitioner

## 2023-11-22 ENCOUNTER — Ambulatory Visit (INDEPENDENT_AMBULATORY_CARE_PROVIDER_SITE_OTHER): Payer: 59 | Admitting: Nurse Practitioner

## 2023-11-22 VITALS — BP 122/84 | HR 84

## 2023-11-22 DIAGNOSIS — Z30433 Encounter for removal and reinsertion of intrauterine contraceptive device: Secondary | ICD-10-CM

## 2023-11-22 DIAGNOSIS — Z3009 Encounter for other general counseling and advice on contraception: Secondary | ICD-10-CM

## 2023-11-22 MED ORDER — LEVONORGESTREL 20 MCG/DAY IU IUD: 1.0000 | INTRAUTERINE_SYSTEM | Freq: Once | INTRAUTERINE | Status: AC

## 2023-11-22 NOTE — Progress Notes (Signed)
   Katelyn Delgado 1971-09-18 [id number]   History:  53 y.o. G1P0101 presents for exchange of Mirena IUD.  Pt has been counseled about risks and benefits as well as complications.   No LMP recorded. (Menstrual status: IUD). STD testing: Declines  Past medical history, past surgical history, family history and social history were all reviewed and documented in the EPIC chart.  ROS:  A ROS was performed and pertinent positives and negatives are included.  Exam: Vitals:   11/22/23 1423  BP: 122/84  Pulse: 84  SpO2: 98%   There is no height or weight on file to calculate BMI.  Pelvic exam: Vulva:  normal female genitalia Vagina:  normal vagina, no discharge, exudate, lesion, or erythema Cervix:  Non-tender, Negative CMT, no lesions or redness. Uterus:  normal shape, position and consistency   Procedure:   Timeout performed and consent obtained.  Speculum inserted. Cervix visualized, IUD string grasped with ring forceps and removed with ease. Cervix cleansed with Betadine x 3. Sterile gloves applied. Tenaculum placed on anterior cervix. Then uterus sounded to 7 cm. IUD inserted easily. Strings trimmed to 3 cm.  Minimal bleeding noted.  Pt tolerated the procedure well.  Assessment/Plan:  Exchange of Mirena IUD   Return for recheck 4-6 weeks Pt aware to call for any concerns Pt aware removal due no later than 8 years from insertion date, IUD card given to pt.   Olivia Mackie DNP, 2:47 PM 11/22/2023

## 2023-12-20 ENCOUNTER — Encounter: Payer: Self-pay | Admitting: Nurse Practitioner

## 2023-12-20 ENCOUNTER — Ambulatory Visit (INDEPENDENT_AMBULATORY_CARE_PROVIDER_SITE_OTHER): Payer: 59 | Admitting: Nurse Practitioner

## 2023-12-20 VITALS — BP 122/82 | HR 92

## 2023-12-20 DIAGNOSIS — N760 Acute vaginitis: Secondary | ICD-10-CM | POA: Diagnosis not present

## 2023-12-20 DIAGNOSIS — Z30431 Encounter for routine checking of intrauterine contraceptive device: Secondary | ICD-10-CM

## 2023-12-20 LAB — WET PREP FOR TRICH, YEAST, CLUE

## 2023-12-20 MED ORDER — CLINDAMYCIN PHOSPHATE 2 % VA CREA: 1.0000 | TOPICAL_CREAM | Freq: Every day | VAGINAL | 0 refills | Status: DC

## 2023-12-20 NOTE — Progress Notes (Signed)
     History:  53 y.o. G1P0101 here today for today for IUD string check; Mirena IUD was placed  11/22/2023. No complaints about the IUD. Occasional light spotting. Has noticed increased discharge to the point she is wearing panty liners. No odor or itching.   The following portions of the patient's history were reviewed and updated as appropriate: allergies, current medications, past family history, past medical history, past social history, past surgical history and problem list. Last pap smear on 06/23/2021 was normal, neg HR HPV.  Review of Systems:  Pertinent items are noted in HPI.   Objective:  Physical Exam Blood pressure 122/82, pulse 92, SpO2 99%. Gen: NAD Abd: Soft, nontender and nondistended Pelvic: Normal appearing external genitalia; normal appearing cervix. Moderate thick, light yellow discharge present, mild erythema. IUD strings visualized, about 2 cm in length outside cervix.  Chaperone offered and declined.  Assessment & Plan:   Normal IUD check. Patient may keep IUD in place for up to 8 years. May remove sooner if she desires.   Wet prep negative for pathogens. Many WBCs and bacteria present Clindamycin gel nightly x 7 days. Declines STD screening. Follow up if symptoms persist.    Wyline Beady, DNP

## 2024-05-07 ENCOUNTER — Encounter: Payer: Self-pay | Admitting: Nurse Practitioner

## 2024-05-07 NOTE — Telephone Encounter (Signed)
 Called patient, no answer.   Staff message to provider.   Encounter closed.

## 2024-06-22 DIAGNOSIS — L709 Acne, unspecified: Secondary | ICD-10-CM | POA: Diagnosis not present

## 2024-06-22 DIAGNOSIS — E538 Deficiency of other specified B group vitamins: Secondary | ICD-10-CM | POA: Diagnosis not present

## 2024-06-22 DIAGNOSIS — Z23 Encounter for immunization: Secondary | ICD-10-CM | POA: Diagnosis not present

## 2024-06-22 DIAGNOSIS — F411 Generalized anxiety disorder: Secondary | ICD-10-CM | POA: Diagnosis not present

## 2024-07-23 DIAGNOSIS — Z6824 Body mass index (BMI) 24.0-24.9, adult: Secondary | ICD-10-CM | POA: Diagnosis not present

## 2024-07-23 DIAGNOSIS — R300 Dysuria: Secondary | ICD-10-CM | POA: Diagnosis not present

## 2024-07-27 DIAGNOSIS — L700 Acne vulgaris: Secondary | ICD-10-CM | POA: Diagnosis not present

## 2024-07-27 DIAGNOSIS — Z79899 Other long term (current) drug therapy: Secondary | ICD-10-CM | POA: Diagnosis not present

## 2024-08-13 ENCOUNTER — Ambulatory Visit: Admitting: Nurse Practitioner

## 2024-08-13 ENCOUNTER — Ambulatory Visit: Payer: Self-pay | Admitting: Nurse Practitioner

## 2024-08-13 ENCOUNTER — Encounter: Payer: Self-pay | Admitting: Nurse Practitioner

## 2024-08-13 VITALS — BP 120/78 | Temp 97.8°F | Resp 16

## 2024-08-13 DIAGNOSIS — R350 Frequency of micturition: Secondary | ICD-10-CM | POA: Diagnosis not present

## 2024-08-13 DIAGNOSIS — N9489 Other specified conditions associated with female genital organs and menstrual cycle: Secondary | ICD-10-CM | POA: Diagnosis not present

## 2024-08-13 DIAGNOSIS — N760 Acute vaginitis: Secondary | ICD-10-CM

## 2024-08-13 LAB — WET PREP FOR TRICH, YEAST, CLUE

## 2024-08-13 MED ORDER — FLUCONAZOLE 150 MG PO TABS: 150.0000 mg | ORAL_TABLET | ORAL | 0 refills | Status: DC

## 2024-08-13 NOTE — Progress Notes (Signed)
   Acute Office Visit  Subjective:    Patient ID: Katelyn Delgado, female    DOB: 1971-02-03, 53 y.o.   MRN: [id number]   HPI 53 y.o. presents today for urinary frequency, urgency and vulvar burning that is not with urination. Has been treated with UTI a couple of times this month. Did initial virtual visit with [facility], no improvement with Bactrim. + culture and treated with Macrobid at PCP office. Symptoms resolved but then returned a few days later.  Thought it may have been a yeast infection and had slight improvement with OTC monistat, last dose last night. Finished antibiotics 6 days ago.   No LMP recorded. (Menstrual status: IUD).    Review of Systems  Constitutional: Negative.   Genitourinary:  Positive for frequency and vaginal pain (Vulvar burning). Negative for difficulty urinating, dysuria, flank pain, genital sores, hematuria, urgency and vaginal discharge.       Objective:    Physical Exam Constitutional:      Appearance: Normal appearance.  Genitourinary:    General: Normal vulva.     Vagina: Vaginal discharge present. No erythema.     Cervix: Normal.     BP 120/78   Temp 97.8 F (36.6 C) (Oral)   Resp 16   SpO2 99%  Wt Readings from Last 3 Encounters:  07/11/23 138 lb (62.6 kg)  06/28/22 139 lb (63 kg)  09/11/21 132 lb (59.9 kg)        Zada Louder, CMA present as chaperone.   Wet prep negative for pathogens  UA negative  Assessment & Plan:   Problem List Items Addressed This Visit   None Visit Diagnoses       Acute vaginitis    -  Primary   Relevant Medications   fluconazole  (DIFLUCAN ) 150 MG tablet     Vulvar burning       Relevant Orders   WET PREP FOR TRICH, YEAST, CLUE (Completed)     Urinary frequency       Relevant Orders   Urinalysis,Complete w/RFL Culture (Completed)      Plan: UA negative. Wet prep negative but cream present. Will provide Diflucan  for possible yeast.   Return if symptoms worsen or fail to  improve.    [name] Shutter DNP, 3:52 PM 08/13/2024

## 2024-08-15 LAB — URINALYSIS, COMPLETE W/RFL CULTURE
Bilirubin Urine: NEGATIVE
Casts: NONE SEEN /LPF
Crystals: NONE SEEN /HPF
Glucose, UA: NEGATIVE
Leukocyte Esterase: NEGATIVE
Nitrites, Initial: NEGATIVE
Protein, ur: NEGATIVE
Specific Gravity, Urine: 1.02 (ref 1.001–1.035)
WBC, UA: NONE SEEN /HPF (ref 0–5)
Yeast: NONE SEEN /HPF
pH: 5.5 (ref 5.0–8.0)

## 2024-08-15 LAB — URINE CULTURE
MICRO NUMBER:: 17213445
Result:: NO GROWTH
SPECIMEN QUALITY:: ADEQUATE

## 2024-08-15 LAB — CULTURE INDICATED

## 2024-08-28 DIAGNOSIS — L700 Acne vulgaris: Secondary | ICD-10-CM | POA: Diagnosis not present

## 2024-08-28 DIAGNOSIS — Z79899 Other long term (current) drug therapy: Secondary | ICD-10-CM | POA: Diagnosis not present

## 2024-09-03 DIAGNOSIS — Z1231 Encounter for screening mammogram for malignant neoplasm of breast: Secondary | ICD-10-CM | POA: Diagnosis not present

## 2024-09-03 LAB — HM MAMMOGRAPHY

## 2024-09-09 NOTE — Progress Notes (Unsigned)
 AVALENE SEALY 1971-05-05 [id number]   History:  53 y.o. G1P0101 presents for annual exam. Mirena  IUD 11/2023. Normal pap history. Anxiety managed by PCP. Complains of vaginal itching/burning and pain with urination. Intermittent, has tried OTC monistat and AZO. Was seen last month for this with negative UA and wet prep. Diflucan  provided.  Gynecologic History No LMP recorded. (Menstrual status: IUD).   Contraception: IUD Sexually active: Yes  Health Maintenance Last Pap: 06/23/2021. Results were: Normal neg HPV Last mammogram: 09/03/2024. Results were: Normal Last colonoscopy: 09/11/2021. Results were: Normal, 10-year recall Last Dexa: Not indicated     09/10/2024    7:46 AM  Depression screen PHQ 2/9  Decreased Interest 0  Down, Depressed, Hopeless 1  PHQ - 2 Score 1     Past medical history, past surgical history, family history and social history were all reviewed and documented in the EPIC chart. Married. Works at [employer]. 77 year old daughter, senior in HS.   ROS:  A ROS was performed and pertinent positives and negatives are included.  Exam:  Vitals:   09/10/24 0740  BP: 120/78  Weight: 137 lb (62.1 kg)  Height: 5' 2.75 (1.594 m)       Body mass index is 24.46 kg/m.  General appearance:  Normal Thyroid:  Symmetrical, normal in size, without palpable masses or nodularity. Respiratory  Auscultation:  Clear without wheezing or rhonchi Cardiovascular  Auscultation:  Regular rate, without rubs, murmurs or gallops  Edema/varicosities:  Not grossly evident Abdominal  Soft,nontender, without masses, guarding or rebound.  Liver/spleen:  No organomegaly noted  Hernia:  None appreciated  Skin  Inspection:  Grossly normal   Breasts: Examined lying and sitting.   Right: Without masses, retractions, discharge or axillary adenopathy.   Left: Without masses, retractions, discharge or axillary adenopathy. Pelvic: External genitalia:  no lesions               Urethra:  normal appearing urethra with no masses, tenderness or lesions              Bartholins and Skenes: normal                 Vagina: normal appearing vagina with normal color and discharge, no lesions              Cervix: no lesions. IUD string visible Bimanual Exam:  Uterus:  no masses or tenderness              Adnexa: no mass, fullness, tenderness              Rectovaginal: Deferred              Anus:  normal, no lesions  Zada Louder, CMA present as chaperone.   UA: trace leukocytes, + nitrites (taking AZO), neg protein, 2+ blood, dark yellow/cloudy. Microscopic: wbc 0-5, rbc 3-10, few bacteria  Assessment/Plan:  53 y.o. G1P0101 for annual exam.    Well female exam with routine gynecological exam - Education provided on SBEs, importance of preventative screenings, current guidelines, high calcium diet, regular exercise, and multivitamin daily. Will get labs at PCP office.   Encounter for routine checking of intrauterine contraceptive device (IUD) - Mirena  IUD inserted 11/2023. Aware of 8-year FDA approval.   Depression screening - PHQ - 1. Managed by PCP.   Pain with urination - Plan: Urinalysis,Complete w/RFL Culture  Dyspareunia in female - Plan: estradiol  (ESTRACE ) 0.01 % CREA vaginal cream twice weekly. Use nightly for 2  weeks.   Vulvar atrophy - Plan: estradiol  (ESTRACE ) 0.01 % CREA vaginal cream twice weekly.   Pain with urination - Plan: Urinalysis,Complete w/RFL Culture. Unremarkable. Culture pending.   Screening for cervical cancer - Normal Pap history.  Will repeat at 5-year interval per guidelines.   Screening for breast cancer - Normal mammogram history.  Continue annual screenings.  Normal breast exam today.  Screening for colon cancer - 09/2021 colonoscopy. Will repeat at 10-year interval per GI recommendation.   Return in about 1 year (around 09/10/2025) for Annual.     [name] Shutter [facility], 10:11 AM 09/10/2024

## 2024-09-10 ENCOUNTER — Encounter: Payer: Self-pay | Admitting: Nurse Practitioner

## 2024-09-10 ENCOUNTER — Telehealth: Payer: Self-pay

## 2024-09-10 ENCOUNTER — Ambulatory Visit: Admitting: Nurse Practitioner

## 2024-09-10 VITALS — BP 120/78 | Ht 62.75 in | Wt 137.0 lb

## 2024-09-10 DIAGNOSIS — Z01419 Encounter for gynecological examination (general) (routine) without abnormal findings: Secondary | ICD-10-CM

## 2024-09-10 DIAGNOSIS — Z30431 Encounter for routine checking of intrauterine contraceptive device: Secondary | ICD-10-CM

## 2024-09-10 DIAGNOSIS — Z1331 Encounter for screening for depression: Secondary | ICD-10-CM

## 2024-09-10 DIAGNOSIS — R309 Painful micturition, unspecified: Secondary | ICD-10-CM | POA: Diagnosis not present

## 2024-09-10 DIAGNOSIS — N941 Unspecified dyspareunia: Secondary | ICD-10-CM

## 2024-09-10 DIAGNOSIS — N905 Atrophy of vulva: Secondary | ICD-10-CM

## 2024-09-10 DIAGNOSIS — E7800 Pure hypercholesterolemia, unspecified: Secondary | ICD-10-CM

## 2024-09-10 MED ORDER — ESTRADIOL 0.01 % VA CREA: 1.0000 g | TOPICAL_CREAM | VAGINAL | 1 refills | Status: AC

## 2024-09-10 NOTE — Telephone Encounter (Signed)
 Patient called and left message that she has been taking Azo.

## 2024-09-10 NOTE — Telephone Encounter (Signed)
 Called and left patient a message to see if she has been taking azo or not. Per Annabella, NP if she has not been taking it then she will treat her for a uti, If not then she will wait until the culture comes back.

## 2024-09-10 NOTE — Telephone Encounter (Signed)
 Thanks. Will wait on urine culture.

## 2024-09-10 NOTE — Telephone Encounter (Signed)
 Left detailed message letting the patient know.

## 2024-09-12 ENCOUNTER — Ambulatory Visit: Payer: Self-pay | Admitting: Nurse Practitioner

## 2024-09-12 ENCOUNTER — Other Ambulatory Visit: Payer: Self-pay | Admitting: Nurse Practitioner

## 2024-09-12 DIAGNOSIS — N3000 Acute cystitis without hematuria: Secondary | ICD-10-CM

## 2024-09-12 LAB — URINE CULTURE
MICRO NUMBER:: 17325890
SPECIMEN QUALITY:: ADEQUATE

## 2024-09-12 LAB — URINALYSIS, COMPLETE W/RFL CULTURE
Bilirubin Urine: NEGATIVE
Glucose, UA: NEGATIVE
Hyaline Cast: NONE SEEN /LPF
Ketones, ur: NEGATIVE
Nitrites, Initial: POSITIVE — AB
Protein, ur: NEGATIVE
Specific Gravity, Urine: 1.015 (ref 1.001–1.035)
pH: 5.5 (ref 5.0–8.0)

## 2024-09-12 LAB — CULTURE INDICATED

## 2024-09-12 MED ORDER — NITROFURANTOIN MONOHYD MACRO 100 MG PO CAPS: 100.0000 mg | ORAL_CAPSULE | Freq: Two times a day (BID) | ORAL | 0 refills | Status: AC

## 2024-10-01 ENCOUNTER — Encounter: Payer: Self-pay | Admitting: Nurse Practitioner

## 2024-10-01 ENCOUNTER — Other Ambulatory Visit: Payer: Self-pay | Admitting: Nurse Practitioner

## 2024-10-01 DIAGNOSIS — Z79890 Hormone replacement therapy (postmenopausal): Secondary | ICD-10-CM

## 2024-10-01 MED ORDER — ESTRADIOL 0.05 MG/24HR TD PTWK: 0.0500 mg | MEDICATED_PATCH | TRANSDERMAL | 1 refills | Status: AC

## 2024-10-01 NOTE — Telephone Encounter (Signed)
 AEX 09/10/24 Mirena  IUD placed 11/2023  Routing to Tiffany to advise on new Rx for HRT

## 2024-10-15 ENCOUNTER — Encounter: Payer: Self-pay | Admitting: Nurse Practitioner
